# Patient Record
Sex: Male | Born: 1945 | Race: Black or African American | Hispanic: No | Marital: Married | Smoking: Never smoker
Health system: Southern US, Community
[De-identification: ages and names within clinical notes are randomized; demographics above are authoritative.]

---

## 2016-11-15 ENCOUNTER — Other Ambulatory Visit: Payer: Self-pay | Admitting: Gastroenterology

## 2016-11-15 DIAGNOSIS — B181 Chronic viral hepatitis B without delta-agent: Secondary | ICD-10-CM

## 2016-11-16 ENCOUNTER — Ambulatory Visit
Admission: RE | Admit: 2016-11-16 | Discharge: 2016-11-16 | Disposition: A | Discharge status: Home / Self Care | Payer: BC Managed Care – PPO | Source: Ambulatory Visit | Attending: Gastroenterology | Admitting: Gastroenterology

## 2016-11-16 DIAGNOSIS — B181 Chronic viral hepatitis B without delta-agent: Secondary | ICD-10-CM

## 2017-12-19 ENCOUNTER — Other Ambulatory Visit: Payer: Self-pay | Admitting: Internal Medicine

## 2017-12-19 ENCOUNTER — Ambulatory Visit: Payer: BC Managed Care – PPO

## 2017-12-19 DIAGNOSIS — R001 Bradycardia, unspecified: Secondary | ICD-10-CM

## 2018-01-02 ENCOUNTER — Ambulatory Visit (INDEPENDENT_AMBULATORY_CARE_PROVIDER_SITE_OTHER): Payer: BC Managed Care – PPO

## 2018-01-02 DIAGNOSIS — R001 Bradycardia, unspecified: Secondary | ICD-10-CM

## 2018-01-08 ENCOUNTER — Encounter (HOSPITAL_COMMUNITY)
Admission: EM | Disposition: A | Discharge status: Home / Self Care | Payer: Self-pay | Source: Home / Self Care | Attending: Neurological Surgery

## 2018-01-08 ENCOUNTER — Emergency Department (HOSPITAL_COMMUNITY): Payer: BC Managed Care – PPO | Admitting: Certified Registered Nurse Anesthetist

## 2018-01-08 ENCOUNTER — Inpatient Hospital Stay (HOSPITAL_COMMUNITY)
Admission: EM | Admit: 2018-01-08 | Discharge: 2018-01-12 | DRG: 026 | Disposition: A | Discharge status: Home / Self Care | Payer: BC Managed Care – PPO | Attending: Neurological Surgery | Admitting: Neurological Surgery

## 2018-01-08 ENCOUNTER — Emergency Department (HOSPITAL_COMMUNITY): Payer: BC Managed Care – PPO

## 2018-01-08 DIAGNOSIS — I69251 Hemiplegia and hemiparesis following other nontraumatic intracranial hemorrhage affecting right dominant side: Secondary | ICD-10-CM | POA: Diagnosis not present

## 2018-01-08 DIAGNOSIS — I6201 Nontraumatic acute subdural hemorrhage: Secondary | ICD-10-CM | POA: Diagnosis present

## 2018-01-08 DIAGNOSIS — S065X9A Traumatic subdural hemorrhage with loss of consciousness of unspecified duration, initial encounter: Secondary | ICD-10-CM

## 2018-01-08 DIAGNOSIS — Z79899 Other long term (current) drug therapy: Secondary | ICD-10-CM

## 2018-01-08 DIAGNOSIS — I10 Essential (primary) hypertension: Secondary | ICD-10-CM | POA: Diagnosis present

## 2018-01-08 DIAGNOSIS — S065XAA Traumatic subdural hemorrhage with loss of consciousness status unknown, initial encounter: Secondary | ICD-10-CM | POA: Diagnosis present

## 2018-01-08 HISTORY — PX: BURR HOLE: SHX908

## 2018-01-08 LAB — I-STAT TROPONIN, ED: Troponin i, poc: 0 ng/mL (ref 0.00–0.08)

## 2018-01-08 LAB — COMPREHENSIVE METABOLIC PANEL
ALBUMIN: 3.7 g/dL (ref 3.5–5.0)
ALT: 23 U/L (ref 0–44)
AST: 25 U/L (ref 15–41)
Alkaline Phosphatase: 45 U/L (ref 38–126)
Anion gap: 7 (ref 5–15)
BUN: 12 mg/dL (ref 8–23)
CO2: 24 mmol/L (ref 22–32)
Calcium: 8.8 mg/dL — ABNORMAL LOW (ref 8.9–10.3)
Chloride: 100 mmol/L (ref 98–111)
Creatinine, Ser: 1.11 mg/dL (ref 0.61–1.24)
GFR calc Af Amer: >60 mL/min (ref >60)
GFR calc non Af Amer: >60 mL/min (ref >60)
GLUCOSE: 197 mg/dL — AB (ref 70–99)
Potassium: 3.3 mmol/L — ABNORMAL LOW (ref 3.5–5.1)
Sodium: 131 mmol/L — ABNORMAL LOW (ref 135–145)
Total Bilirubin: 1.2 mg/dL (ref 0.3–1.2)
Total Protein: 7.3 g/dL (ref 6.5–8.1)

## 2018-01-08 LAB — CBC
HCT: 38.3 % — ABNORMAL LOW (ref 39.0–52.0)
HEMOGLOBIN: 11.4 g/dL — AB (ref 13.0–17.0)
MCH: 29 pg (ref 26.0–34.0)
MCHC: 29.8 g/dL — ABNORMAL LOW (ref 30.0–36.0)
MCV: 97.5 fL (ref 80.0–100.0)
Platelets: 200 10*3/uL (ref 150–400)
RBC: 3.93 MIL/uL — AB (ref 4.22–5.81)
RDW: 11.7 % (ref 11.5–15.5)
WBC: 6.2 10*3/uL (ref 4.0–10.5)
nRBC: 0 % (ref 0.0–0.2)

## 2018-01-08 LAB — DIFFERENTIAL
ABS IMMATURE GRANULOCYTES: 0.02 10*3/uL (ref 0.00–0.07)
Basophils Absolute: 0 10*3/uL (ref 0.0–0.1)
Basophils Relative: 0 %
Eosinophils Absolute: 0 10*3/uL (ref 0.0–0.5)
Eosinophils Relative: 1 %
IMMATURE GRANULOCYTES: 0 %
Lymphocytes Relative: 11 %
Lymphs Abs: 0.7 10*3/uL (ref 0.7–4.0)
Monocytes Absolute: 0.9 10*3/uL (ref 0.1–1.0)
Monocytes Relative: 15 %
NEUTROS ABS: 4.5 10*3/uL (ref 1.7–7.7)
Neutrophils Relative %: 73 %

## 2018-01-08 LAB — PROTIME-INR
INR: 1.09
Prothrombin Time: 14 seconds (ref 11.4–15.2)

## 2018-01-08 LAB — APTT: APTT: 31 s (ref 24–36)

## 2018-01-08 LAB — MRSA PCR SCREENING: MRSA by PCR: NEGATIVE

## 2018-01-08 SURGERY — CREATION, CRANIAL BURR HOLE
Anesthesia: General | Site: Head | Laterality: Right

## 2018-01-08 MED ORDER — SODIUM CHLORIDE 0.9 % IV SOLN
INTRAVENOUS | Status: DC | PRN
  Administered 2018-01-08: 25 ug/min via INTRAVENOUS

## 2018-01-08 MED ORDER — ONDANSETRON HCL 4 MG PO TABS: 4.0000 mg | ORAL_TABLET | ORAL | Status: DC | PRN

## 2018-01-08 MED ORDER — POLYETHYLENE GLYCOL 3350 17 G PO PACK: 17.0000 g | PACK | Freq: Every day | ORAL | Status: DC | PRN

## 2018-01-08 MED ORDER — SODIUM CHLORIDE 0.9 % IV BOLUS
500.0000 mL | Freq: Once | INTRAVENOUS | Status: AC
  Administered 2018-01-08: 500 mL via INTRAVENOUS

## 2018-01-08 MED ORDER — ACETAMINOPHEN 650 MG RE SUPP: 650.0000 mg | RECTAL | Status: DC | PRN

## 2018-01-08 MED ORDER — LABETALOL HCL 5 MG/ML IV SOLN: 10.0000 mg | INTRAVENOUS | Status: DC | PRN

## 2018-01-08 MED ORDER — LIDOCAINE-EPINEPHRINE 1 %-1:100000 IJ SOLN
INTRAMUSCULAR | Status: DC | PRN
  Administered 2018-01-08: 3 mL

## 2018-01-08 MED ORDER — BRIMONIDINE TARTRATE 0.2 % OP SOLN
1.0000 [drp] | Freq: Two times a day (BID) | OPHTHALMIC | Status: DC
  Administered 2018-01-08 – 2018-01-12 (×8): 1 [drp] via OPHTHALMIC
  Filled 2018-01-08: qty 5

## 2018-01-08 MED ORDER — BACITRACIN ZINC 500 UNIT/GM EX OINT
TOPICAL_OINTMENT | CUTANEOUS | Status: AC
  Filled 2018-01-08: qty 28.35

## 2018-01-08 MED ORDER — MEPERIDINE HCL 50 MG/ML IJ SOLN: 6.2500 mg | INTRAMUSCULAR | Status: DC | PRN

## 2018-01-08 MED ORDER — SODIUM CHLORIDE 0.9 % IV SOLN
INTRAVENOUS | Status: DC | PRN
  Administered 2018-01-08: 15:00:00 via INTRAVENOUS

## 2018-01-08 MED ORDER — LIDOCAINE 2% (20 MG/ML) 5 ML SYRINGE
INTRAMUSCULAR | Status: DC | PRN
  Administered 2018-01-08: 100 mg via INTRAVENOUS

## 2018-01-08 MED ORDER — DEXAMETHASONE SODIUM PHOSPHATE 10 MG/ML IJ SOLN
INTRAMUSCULAR | Status: AC
  Filled 2018-01-08: qty 1

## 2018-01-08 MED ORDER — ACETAMINOPHEN 325 MG PO TABS
650.0000 mg | ORAL_TABLET | ORAL | Status: DC | PRN
  Administered 2018-01-11: 650 mg via ORAL
  Filled 2018-01-08: qty 2

## 2018-01-08 MED ORDER — LOSARTAN POTASSIUM-HCTZ 50-12.5 MG PO TABS: 1.0000 | ORAL_TABLET | Freq: Every day | ORAL | Status: DC

## 2018-01-08 MED ORDER — MIDAZOLAM HCL 2 MG/2ML IJ SOLN
INTRAMUSCULAR | Status: AC
  Filled 2018-01-08: qty 2

## 2018-01-08 MED ORDER — HYDROMORPHONE HCL 1 MG/ML IJ SOLN
0.5000 mg | INTRAMUSCULAR | Status: DC | PRN
  Administered 2018-01-08: 0.5 mg via INTRAVENOUS
  Filled 2018-01-08: qty 1

## 2018-01-08 MED ORDER — THROMBIN 20000 UNITS EX SOLR
CUTANEOUS | Status: AC
  Filled 2018-01-08: qty 20000

## 2018-01-08 MED ORDER — TIMOLOL MALEATE 0.5 % OP SOLN
1.0000 [drp] | Freq: Two times a day (BID) | OPHTHALMIC | Status: DC
  Administered 2018-01-08 – 2018-01-12 (×8): 1 [drp] via OPHTHALMIC
  Filled 2018-01-08: qty 5

## 2018-01-08 MED ORDER — EPHEDRINE SULFATE 50 MG/ML IJ SOLN
INTRAMUSCULAR | Status: DC | PRN
  Administered 2018-01-08: 10 mg via INTRAVENOUS

## 2018-01-08 MED ORDER — ROCURONIUM BROMIDE 10 MG/ML (PF) SYRINGE
PREFILLED_SYRINGE | INTRAVENOUS | Status: DC | PRN
  Administered 2018-01-08: 50 mg via INTRAVENOUS

## 2018-01-08 MED ORDER — MIDAZOLAM HCL 2 MG/2ML IJ SOLN
INTRAMUSCULAR | Status: DC | PRN
  Administered 2018-01-08: 2 mg via INTRAVENOUS

## 2018-01-08 MED ORDER — SODIUM CHLORIDE 0.9 % IV SOLN
100.0000 mL/h | INTRAVENOUS | Status: DC
  Administered 2018-01-08: 100 mL/h via INTRAVENOUS

## 2018-01-08 MED ORDER — PHENYLEPHRINE 40 MCG/ML (10ML) SYRINGE FOR IV PUSH (FOR BLOOD PRESSURE SUPPORT)
PREFILLED_SYRINGE | INTRAVENOUS | Status: AC
  Filled 2018-01-08: qty 10

## 2018-01-08 MED ORDER — CEFAZOLIN SODIUM-DEXTROSE 2-4 GM/100ML-% IV SOLN
INTRAVENOUS | Status: AC
  Filled 2018-01-08: qty 100

## 2018-01-08 MED ORDER — DEXAMETHASONE SODIUM PHOSPHATE 10 MG/ML IJ SOLN
INTRAMUSCULAR | Status: DC | PRN
  Administered 2018-01-08: 10 mg via INTRAVENOUS

## 2018-01-08 MED ORDER — LIDOCAINE 2% (20 MG/ML) 5 ML SYRINGE
INTRAMUSCULAR | Status: AC
  Filled 2018-01-08: qty 5

## 2018-01-08 MED ORDER — 0.9 % SODIUM CHLORIDE (POUR BTL) OPTIME
TOPICAL | Status: DC | PRN
  Administered 2018-01-08 (×2): 1000 mL

## 2018-01-08 MED ORDER — ONDANSETRON HCL 4 MG/2ML IJ SOLN
INTRAMUSCULAR | Status: DC | PRN
  Administered 2018-01-08: 4 mg via INTRAVENOUS

## 2018-01-08 MED ORDER — TENOFOVIR ALAFENAMIDE FUMARATE 25 MG PO TABS
25.0000 mg | ORAL_TABLET | Freq: Every day | ORAL | Status: DC
  Administered 2018-01-08 – 2018-01-11 (×4): 25 mg via ORAL
  Filled 2018-01-08 (×5): qty 1

## 2018-01-08 MED ORDER — ONDANSETRON HCL 4 MG/2ML IJ SOLN: 4.0000 mg | INTRAMUSCULAR | Status: DC | PRN

## 2018-01-08 MED ORDER — LIDOCAINE-EPINEPHRINE 1 %-1:100000 IJ SOLN
INTRAMUSCULAR | Status: AC
  Filled 2018-01-08: qty 1

## 2018-01-08 MED ORDER — FAMOTIDINE IN NACL 20-0.9 MG/50ML-% IV SOLN
20.0000 mg | Freq: Two times a day (BID) | INTRAVENOUS | Status: DC
  Administered 2018-01-08 – 2018-01-09 (×3): 20 mg via INTRAVENOUS
  Filled 2018-01-08 (×3): qty 50

## 2018-01-08 MED ORDER — HYDROMORPHONE HCL 1 MG/ML IJ SOLN: 0.2500 mg | INTRAMUSCULAR | Status: DC | PRN

## 2018-01-08 MED ORDER — SUCCINYLCHOLINE CHLORIDE 200 MG/10ML IV SOSY
PREFILLED_SYRINGE | INTRAVENOUS | Status: AC
  Filled 2018-01-08: qty 10

## 2018-01-08 MED ORDER — SODIUM CHLORIDE 0.9 % IV SOLN
INTRAVENOUS | Status: DC | PRN
  Administered 2018-01-08: 16:00:00

## 2018-01-08 MED ORDER — PHENYLEPHRINE HCL 10 MG/ML IJ SOLN
INTRAMUSCULAR | Status: DC | PRN
  Administered 2018-01-08: 40 ug via INTRAVENOUS
  Administered 2018-01-08: 120 ug via INTRAVENOUS

## 2018-01-08 MED ORDER — LOSARTAN POTASSIUM 50 MG PO TABS
50.0000 mg | ORAL_TABLET | Freq: Every day | ORAL | Status: DC
  Administered 2018-01-09 – 2018-01-12 (×4): 50 mg via ORAL
  Filled 2018-01-08 (×4): qty 1

## 2018-01-08 MED ORDER — FENTANYL CITRATE (PF) 250 MCG/5ML IJ SOLN
INTRAMUSCULAR | Status: AC
  Filled 2018-01-08: qty 5

## 2018-01-08 MED ORDER — HYDROCODONE-ACETAMINOPHEN 5-325 MG PO TABS
1.0000 | ORAL_TABLET | ORAL | Status: DC | PRN
  Administered 2018-01-08 – 2018-01-11 (×8): 1 via ORAL
  Filled 2018-01-08 (×8): qty 1

## 2018-01-08 MED ORDER — CEFAZOLIN SODIUM-DEXTROSE 2-4 GM/100ML-% IV SOLN
2.0000 g | Freq: Three times a day (TID) | INTRAVENOUS | Status: AC
  Administered 2018-01-08 – 2018-01-09 (×2): 2 g via INTRAVENOUS
  Filled 2018-01-08 (×2): qty 100

## 2018-01-08 MED ORDER — HYDROCHLOROTHIAZIDE 12.5 MG PO CAPS
12.5000 mg | ORAL_CAPSULE | Freq: Every day | ORAL | Status: DC
  Administered 2018-01-09 – 2018-01-12 (×4): 12.5 mg via ORAL
  Filled 2018-01-08 (×4): qty 1

## 2018-01-08 MED ORDER — THROMBIN 20000 UNITS EX SOLR
CUTANEOUS | Status: DC | PRN
  Administered 2018-01-08: 16:00:00 via TOPICAL

## 2018-01-08 MED ORDER — DOCUSATE SODIUM 100 MG PO CAPS
100.0000 mg | ORAL_CAPSULE | Freq: Two times a day (BID) | ORAL | Status: DC
  Administered 2018-01-08 – 2018-01-12 (×8): 100 mg via ORAL
  Filled 2018-01-08 (×8): qty 1

## 2018-01-08 MED ORDER — PROMETHAZINE HCL 25 MG PO TABS: 12.5000 mg | ORAL_TABLET | ORAL | Status: DC | PRN

## 2018-01-08 MED ORDER — BRIMONIDINE TARTRATE-TIMOLOL 0.2-0.5 % OP SOLN: 1.0000 [drp] | Freq: Two times a day (BID) | OPHTHALMIC | Status: DC

## 2018-01-08 MED ORDER — ONDANSETRON HCL 4 MG/2ML IJ SOLN: 4.0000 mg | Freq: Once | INTRAMUSCULAR | Status: DC | PRN

## 2018-01-08 MED ORDER — BACITRACIN ZINC 500 UNIT/GM EX OINT
TOPICAL_OINTMENT | CUTANEOUS | Status: DC | PRN
  Administered 2018-01-08: 1 via TOPICAL

## 2018-01-08 MED ORDER — SUGAMMADEX SODIUM 200 MG/2ML IV SOLN
INTRAVENOUS | Status: DC | PRN
  Administered 2018-01-08: 200 mg via INTRAVENOUS

## 2018-01-08 MED ORDER — PROPOFOL 10 MG/ML IV BOLUS
INTRAVENOUS | Status: AC
  Filled 2018-01-08: qty 20

## 2018-01-08 MED ORDER — PROPOFOL 10 MG/ML IV BOLUS
INTRAVENOUS | Status: DC | PRN
  Administered 2018-01-08: 120 mg via INTRAVENOUS

## 2018-01-08 MED ORDER — CEFAZOLIN SODIUM-DEXTROSE 2-3 GM-%(50ML) IV SOLR
INTRAVENOUS | Status: DC | PRN
  Administered 2018-01-08: 2 g via INTRAVENOUS

## 2018-01-08 MED ORDER — ONDANSETRON HCL 4 MG/2ML IJ SOLN
INTRAMUSCULAR | Status: AC
  Filled 2018-01-08: qty 2

## 2018-01-08 MED ORDER — FENTANYL CITRATE (PF) 250 MCG/5ML IJ SOLN
INTRAMUSCULAR | Status: DC | PRN
  Administered 2018-01-08: 50 ug via INTRAVENOUS
  Administered 2018-01-08: 100 ug via INTRAVENOUS

## 2018-01-08 MED ORDER — ROCURONIUM BROMIDE 50 MG/5ML IV SOSY
PREFILLED_SYRINGE | INTRAVENOUS | Status: AC
  Filled 2018-01-08: qty 5

## 2018-01-08 SURGICAL SUPPLY — 48 items
BAG DECANTER FOR FLEXI CONT (MISCELLANEOUS) ×2 IMPLANT
BLADE SURG 11 STRL SS (BLADE) ×2 IMPLANT
BUR ACORN 9.0 PRECISION (BURR) ×2 IMPLANT
CANISTER SUCT 3000ML PPV (MISCELLANEOUS) ×2 IMPLANT
CARTRIDGE OIL MAESTRO DRILL (MISCELLANEOUS) ×1 IMPLANT
CATH VENTRIC 35X38 W/TROCAR LG (CATHETERS) ×2 IMPLANT
COVER WAND RF STERILE (DRAPES) ×2 IMPLANT
DECANTER SPIKE VIAL GLASS SM (MISCELLANEOUS) ×2 IMPLANT
DERMABOND ADVANCED (GAUZE/BANDAGES/DRESSINGS) ×1
DERMABOND ADVANCED .7 DNX12 (GAUZE/BANDAGES/DRESSINGS) ×1 IMPLANT
DIFFUSER DRILL AIR PNEUMATIC (MISCELLANEOUS) ×2 IMPLANT
DRAPE NEUROLOGICAL W/INCISE (DRAPES) ×2 IMPLANT
DRAPE SURG 17X23 STRL (DRAPES) IMPLANT
DRAPE WARM FLUID 44X44 (DRAPE) ×2 IMPLANT
DRSG OPSITE POSTOP 4X6 (GAUZE/BANDAGES/DRESSINGS) ×4 IMPLANT
ELECT REM PT RETURN 9FT ADLT (ELECTROSURGICAL) ×2
ELECTRODE REM PT RTRN 9FT ADLT (ELECTROSURGICAL) ×1 IMPLANT
GAUZE 4X4 16PLY RFD (DISPOSABLE) IMPLANT
GAUZE SPONGE 4X4 12PLY STRL (GAUZE/BANDAGES/DRESSINGS) IMPLANT
GLOVE BIO SURGEON STRL SZ7 (GLOVE) ×2 IMPLANT
GLOVE BIO SURGEON STRL SZ8 (GLOVE) ×2 IMPLANT
GLOVE BIOGEL PI IND STRL 7.0 (GLOVE) ×1 IMPLANT
GLOVE BIOGEL PI INDICATOR 7.0 (GLOVE) ×1
GLOVE INDICATOR 8.5 STRL (GLOVE) ×2 IMPLANT
GOWN STRL REUS W/ TWL LRG LVL3 (GOWN DISPOSABLE) ×2 IMPLANT
GOWN STRL REUS W/ TWL XL LVL3 (GOWN DISPOSABLE) ×2 IMPLANT
GOWN STRL REUS W/TWL 2XL LVL3 (GOWN DISPOSABLE) IMPLANT
GOWN STRL REUS W/TWL LRG LVL3 (GOWN DISPOSABLE) ×2
GOWN STRL REUS W/TWL XL LVL3 (GOWN DISPOSABLE) ×2
HEMOSTAT SURGICEL 2X14 (HEMOSTASIS) IMPLANT
HOOK DURA (MISCELLANEOUS) ×2 IMPLANT
KIT BASIN OR (CUSTOM PROCEDURE TRAY) ×2 IMPLANT
KIT DRAIN CSF ACCUDRAIN (MISCELLANEOUS) ×2 IMPLANT
KIT TURNOVER KIT B (KITS) ×2 IMPLANT
NEEDLE HYPO 25X1 1.5 SAFETY (NEEDLE) ×2 IMPLANT
NS IRRIG 1000ML POUR BTL (IV SOLUTION) ×4 IMPLANT
OIL CARTRIDGE MAESTRO DRILL (MISCELLANEOUS) ×2
PACK CRANIOTOMY CUSTOM (CUSTOM PROCEDURE TRAY) ×2 IMPLANT
PAD ARMBOARD 7.5X6 YLW CONV (MISCELLANEOUS) ×6 IMPLANT
SPONGE NEURO XRAY DETECT 1X3 (DISPOSABLE) IMPLANT
SPONGE SURGIFOAM ABS GEL 100 (HEMOSTASIS) ×2 IMPLANT
STAPLER VISISTAT 35W (STAPLE) ×2 IMPLANT
SUT NURALON 4 0 TR CR/8 (SUTURE) ×4 IMPLANT
SUT VIC AB 2-0 CT1 18 (SUTURE) ×2 IMPLANT
TOWEL GREEN STERILE (TOWEL DISPOSABLE) ×2 IMPLANT
TOWEL GREEN STERILE FF (TOWEL DISPOSABLE) ×2 IMPLANT
TRAY FOLEY MTR SLVR 16FR STAT (SET/KITS/TRAYS/PACK) ×2 IMPLANT
WATER STERILE IRR 1000ML POUR (IV SOLUTION) ×2 IMPLANT

## 2018-01-08 NOTE — ED Notes (Signed)
Pt taken to OR bay 36, handoff to CRNA Alisha.

## 2018-01-08 NOTE — Anesthesia Procedure Notes (Signed)
Procedure Name: Intubation Performed by: Clearnce Sorrel, CRNA Pre-anesthesia Checklist: Patient identified, Emergency Drugs available, Suction available, Patient being monitored and Timeout performed Patient Re-evaluated:Patient Re-evaluated prior to induction Oxygen Delivery Method: Circle system utilized Preoxygenation: Pre-oxygenation with 100% oxygen Induction Type: IV induction Ventilation: Mask ventilation without difficulty Laryngoscope Size: Mac and 4 Grade View: Grade I Tube type: Oral Tube size: 7.5 mm Number of attempts: 1 Airway Equipment and Method: Stylet Placement Confirmation: ETT inserted through vocal cords under direct vision,  positive ETCO2 and breath sounds checked- equal and bilateral Secured at: 22 cm Tube secured with: Tape Dental Injury: Teeth and Oropharynx as per pre-operative assessment

## 2018-01-08 NOTE — Anesthesia Postprocedure Evaluation (Signed)
Anesthesia Post Note  Patient: John Diaz  Procedure(s) Performed: Ines BloomerBURR HOLES (Right Head)     Patient location during evaluation: PACU Anesthesia Type: General Level of consciousness: awake and alert Pain management: pain level controlled Vital Signs Assessment: post-procedure vital signs reviewed and stable Respiratory status: spontaneous breathing, nonlabored ventilation, respiratory function stable and patient connected to nasal cannula oxygen Cardiovascular status: blood pressure returned to baseline and stable Postop Assessment: no apparent nausea or vomiting Anesthetic complications: no    Last Vitals:  Vitals:   01/08/18 1900 01/08/18 1918  BP: (!) 143/67 (!) 143/67  Pulse: (!) 57 (!) 57  Resp: 16 16  Temp:  37.2 C  SpO2: 97%     Last Pain:  Vitals:   01/08/18 1918  TempSrc: Oral  PainSc:                  Cristy Colmenares DAVID

## 2018-01-08 NOTE — ED Provider Notes (Signed)
[facility] EMERGENCY DEPARTMENT Provider Note   CSN: [id number] Arrival date & time: 01/08/18  1220     History   Chief Complaint Chief Complaint  Patient presents with  . Weakness    HPI Jamieon Lannen is a 72 y.o. male.  HPI  Patient presents with concern of weakness and speech difficulty. Patient states that he is generally well, but now, since some point yesterday, about 24 hours ago he has felt weakness in his right side, as well as slowness of speech. No pain, no confusion, no syncope. He denies recent medication change, diet change, activity change. He has no history of CVA. Patient is here, via EMS, who assists with the HPI.   Past medical history Hypertension    Home Medications    Prior to Admission medications   Medication Sig Start Date End Date Taking? Authorizing Provider  COMBIGAN 0.2-0.5 % ophthalmic solution Place 1 drop into both eyes 2 (two) times daily. 11/10/17  Yes [provider]  losartan-hydrochlorothiazide (HYZAAR) 50-12.5 MG tablet Take 1 tablet by mouth daily. 01/04/18  Yes [provider]  VEMLIDY 25 MG TABS Take 25 mg by mouth daily. 01/03/18  Yes [provider]  Vitamin D, Ergocalciferol, (DRISDOL) 1.25 MG (50000 UT) CAPS capsule Take 50,000 Units by mouth every 7 (seven) days.   Yes [provider]     Social History Does not smoke, does not drink  Lives alone  Allergies   Patient has no known allergies.   Review of Systems Review of Systems  Constitutional:       Per HPI, otherwise negative  HENT:       Per HPI, otherwise negative  Respiratory:       Per HPI, otherwise negative  Cardiovascular:       Per HPI, otherwise negative  Gastrointestinal: Negative for vomiting.  Endocrine:       Negative aside from HPI  Genitourinary:       Neg aside from HPI   Musculoskeletal:       Per HPI, otherwise negative  Skin: Negative.   Neurological: Positive for speech  difficulty and weakness. Negative for syncope.     Physical Exam Updated Vital Signs BP 117/61   Pulse 71   Temp 98.7 F (37.1 C) (Oral)   Resp 19   SpO2 96%   Physical Exam  Constitutional: He is oriented to person, place, and time. He appears well-developed. No distress.  HENT:  Head: Normocephalic and atraumatic.  Eyes: Conjunctivae and EOM are normal.  Cardiovascular: Normal rate and regular rhythm.  Pulmonary/Chest: Effort normal. No stridor. No respiratory distress.  Abdominal: He exhibits no distension.  Musculoskeletal: He exhibits no edema.  Neurological: He is alert and oriented to person, place, and time. He displays no atrophy.  Speech slow, significantly deliberate, slight slurring, no facial asymmetry. Both upper extremities have 5/5 strength with equal grip strength bilaterally. Right lower extremities 3/5 strength. Extraocular motion intact, patient tracks appropriately.   Skin: Skin is warm and dry.  Psychiatric: He has a normal mood and affect.  Nursing note and vitals reviewed.    ED Treatments / Results  Labs (all labs ordered are listed, but only abnormal results are displayed) Labs Reviewed  CBC - Abnormal; Notable for the following components:      Result Value   RBC 3.93 (*)    Hemoglobin 11.4 (*)    HCT 38.3 (*)    MCHC 29.8 (*)    All  other components within normal limits  COMPREHENSIVE METABOLIC PANEL - Abnormal; Notable for the following components:   Sodium 131 (*)    Potassium 3.3 (*)    Glucose, Bld 197 (*)    Calcium 8.8 (*)    All other components within normal limits  DIFFERENTIAL  PROTIME-INR  APTT  RAPID URINE DRUG SCREEN, HOSP PERFORMED  URINALYSIS, ROUTINE W REFLEX MICROSCOPIC  I-STAT TROPONIN, ED    EKG EKG Interpretation  Date/Time:  Sunday January 08 2018 12:24:20 EST Ventricular Rate:  83 PR Interval:    QRS Duration: 91 QT Interval:  352 QTC Calculation: 414 R Axis:   82 Text Interpretation:  Sinus rhythm  Prolonged PR interval Borderline right axis deviation Abnormal ekg Confirmed by Gerhard MunchLockwood, Khai Torbert [phone]) on 01/08/2018 1:21:15 PM   Radiology Ct Head Wo Contrast  Result Date: 01/08/2018 CLINICAL DATA:  Right-sided weakness beginning yesterday. EXAM: CT HEAD WITHOUT CONTRAST TECHNIQUE: Contiguous axial images were obtained from the base of the skull through the vertex without intravenous contrast. COMPARISON:  None. FINDINGS: Brain: Left convexity subdural hematoma measuring up to 2.7 cm in thickness, with mass effect and left-to-right shift of 7.5 mm. Underlying brain parenchyma appears normal. No posterior fossa abnormality. No hydrocephalus or ventricular trapping. Vascular: There is atherosclerotic calcification of the major vessels at the base of the brain. Skull: Normal Sinuses/Orbits: Clear/normal Other: None IMPRESSION: Left convexity subdural hematoma measuring up to 2.7 cm in thickness with mass effect and left-to-right shift of 7.5 mm. Critical Value/emergent results were called by telephone at the time of interpretation on 01/08/2018 at 1:50 pm to Dr. Gerhard MunchOBERT Laythan Hayter , who verbally acknowledged these results. Electronically Signed   By: Paulina FusiMark  Shogry M.D.   On: 01/08/2018 13:54    Procedures Procedures (including critical care time)  Medications Ordered in ED Medications  sodium chloride 0.9 % bolus 500 mL (0 mLs Intravenous Stopped 01/08/18 1402)    Followed by  0.9 %  sodium chloride infusion (100 mL/hr Intravenous New Bag/Given 01/08/18 1257)     Initial Impression / Assessment and Plan / ED Course  I have reviewed the triage vital signs and the nursing notes.  Pertinent labs & imaging results that were available during my care of the patient were reviewed by me and considered in my medical decision making (see chart for details).    2:19 PM On repeat exam the patient is in similar condition, awake and alert, though with slow speech. I have discussed the patient's CT scan,  reviewed the findings, with our radiologist. Subsequently discussed the patient's case with our neurosurgical colleagues given finding of subdural hematoma with midline shift. Patient is not on blood thinning medication, remains awake and alert, though with after mentioned slowness of speech, right-sided neurologic deficits. Patient will require admission to our neurosurgical colleagues for further evaluation and management.  Final Clinical Impressions(s) / ED Diagnoses  Subdural hematoma  CRITICAL CARE Performed by: Gerhard Munchobert Nahjae Hoeg Total critical care time: 35 minutes Critical care time was exclusive of separately billable procedures and treating other patients. Critical care was necessary to treat or prevent imminent or life-threatening deterioration. Critical care was time spent personally by me on the following activities: development of treatment plan with patient and/or surrogate as well as nursing, discussions with consultants, evaluation of patient's response to treatment, examination of patient, obtaining history from patient or surrogate, ordering and performing treatments and interventions, ordering and review of laboratory studies, ordering and review of radiographic studies, pulse oximetry and re-evaluation of  patient's condition.    Gerhard Munch, MD 01/08/18 1420

## 2018-01-08 NOTE — Op Note (Signed)
Preoperative diagnosis: Left-sided subacute subdural hematoma  Postoperative diagnosis: Same.   Procedure:left-sided bur hole craniectomy for evacuation of subacute subdural hematoma  Surgeon: Jillyn HiddenGary Tejon Gracie  Asst.: Ervin Knackhomas Ostergaard  Anesthesia: Gen.  EBL: Minimal  History of present illness: 72 year old gentleman who presents with altered mental status speech difficulty workup and difficulty CT scan showed a large left-sided subacute subdural hematoma due the size mass effect and progressive conical syndrome or recommended burr hole craniectomy for evacuation. I extensively went over the risks and benefits of the procedure as well as perioperative course expectations of outcome alternatives of surgery and he understood and agreed to proceed forward.  Operative procedure: Patient brought into the or was induced on general anesthesia positioned supine shoulder bump under his left shoulder head turned the right exposing the superotemporal on the left frontoparietal he. 2 burr hole incisions were drawn out frontally and one parietally 2 bur holes were drilled dura was coagulated and divided in a cruciate fashion a large amount of dark blood came out under pressure consistent with a subacute hematoma. There was a 6 minute cortical membrane that I lysed both in the frontal burr hole and the parietal bur hole to create (medication irrigated copiously from one bur hole and neck utilizing a regular catheter. After all the irrigant coming through was clear I then placed a ventricular catheter as a subdural drain. She was in a debated went to recovery in stable condition. At the end of case on it counts sponge counts were correct.

## 2018-01-08 NOTE — H&P (Signed)
John Diaz is an 72 y.o. male.   Chief Complaint: headache confusion and difficulty with speech and word finding HPI: 72 year old gentleman presented to the ER with confusion and word finding difficulty and speech difficulty. Workup has revealed a 2.7 cm subacute subdural hematoma on the left. Due to the size of the subdural mass effect I recommended burr hole craniectomy possible craniotomy for evacuation of hematoma. I've extensively gone over the risks and benefits of the procedure with the patient and his wife and they understand and agree to proceed forward.  No past medical history on file.    No family history on file. Social History:  has no tobacco, alcohol, and drug history on file.  Allergies: No Known Allergies   (Not in a hospital admission)  Results for orders placed or performed during the hospital encounter of 01/08/18 (from the past 48 hour(s))  CBC     Status: Abnormal   Collection Time: 01/08/18 12:30 PM  Result Value Ref Range   WBC 6.2 4.0 - 10.5 K/uL   RBC 3.93 (L) 4.22 - 5.81 MIL/uL   Hemoglobin 11.4 (L) 13.0 - 17.0 g/dL   HCT 29.538.3 (L) 28.439.0 - 13.252.0 %   MCV 97.5 80.0 - 100.0 fL   MCH 29.0 26.0 - 34.0 pg   MCHC 29.8 (L) 30.0 - 36.0 g/dL   RDW 44.011.7 10.211.5 - 72.515.5 %   Platelets 200 150 - 400 K/uL   nRBC 0.0 0.0 - 0.2 %    Comment: Performed at [facility], [address] [place], [place] [address]  Differential     Status: None   Collection Time: 01/08/18 12:30 PM  Result Value Ref Range   Neutrophils Relative % 73 %   Neutro Abs 4.5 1.7 - 7.7 K/uL   Lymphocytes Relative 11 %   Lymphs Abs 0.7 0.7 - 4.0 K/uL   Monocytes Relative 15 %   Monocytes Absolute 0.9 0.1 - 1.0 K/uL   Eosinophils Relative 1 %   Eosinophils Absolute 0.0 0.0 - 0.5 K/uL   Basophils Relative 0 %   Basophils Absolute 0.0 0.0 - 0.1 K/uL   Immature Granulocytes 0 %   Abs Immature Granulocytes 0.02 0.00 - 0.07 K/uL    Comment: Performed at [facility], [address]  [place], [place] [address]  Comprehensive metabolic panel     Status: Abnormal   Collection Time: 01/08/18 12:30 PM  Result Value Ref Range   Sodium 131 (L) 135 - 145 mmol/L   Potassium 3.3 (L) 3.5 - 5.1 mmol/L   Chloride 100 98 - 111 mmol/L   CO2 24 22 - 32 mmol/L   Glucose, Bld 197 (H) 70 - 99 mg/dL   BUN 12 8 - 23 mg/dL   Creatinine, Ser 2.591.11 0.61 - 1.24 mg/dL   Calcium 8.8 (L) 8.9 - 10.3 mg/dL   Total Protein 7.3 6.5 - 8.1 g/dL   Albumin 3.7 3.5 - 5.0 g/dL   AST 25 15 - 41 U/L   ALT 23 0 - 44 U/L   Alkaline Phosphatase 45 38 - 126 U/L   Total Bilirubin 1.2 0.3 - 1.2 mg/dL   GFR calc non Af Amer >60 >60 mL/min   GFR calc Af Amer >60 >60 mL/min   Anion gap 7 5 - 15    Comment: Performed at [facility], [address] [place], [place] [address]  I-stat troponin, ED     Status: None  Collection Time: 01/08/18  1:20 PM  Result Value Ref Range   Troponin i, poc 0.00 0.00 - 0.08 ng/mL   Comment 3            Comment: Due to the release kinetics of cTnI, a negative result within the first hours of the onset of symptoms does not rule out myocardial infarction with certainty. If myocardial infarction is still suspected, repeat the test at appropriate intervals.   Protime-INR     Status: None   Collection Time: 01/08/18  1:40 PM  Result Value Ref Range   Prothrombin Time 14.0 11.4 - 15.2 seconds   INR 1.09     Comment: Performed at [facility], [address] [place], [place] [address]  APTT     Status: None   Collection Time: 01/08/18  1:40 PM  Result Value Ref Range   aPTT 31 24 - 36 seconds    Comment: Performed at [facility], [address] [place], [place] [address]   Ct Head Wo Contrast  Result Date: 01/08/2018 CLINICAL DATA:  Right-sided weakness beginning yesterday. EXAM: CT HEAD WITHOUT CONTRAST TECHNIQUE: Contiguous axial images were obtained from the base of the skull through the vertex without intravenous contrast. COMPARISON:  None. FINDINGS:  Brain: Left convexity subdural hematoma measuring up to 2.7 cm in thickness, with mass effect and left-to-right shift of 7.5 mm. Underlying brain parenchyma appears normal. No posterior fossa abnormality. No hydrocephalus or ventricular trapping. Vascular: There is atherosclerotic calcification of the major vessels at the base of the brain. Skull: Normal Sinuses/Orbits: Clear/normal Other: None IMPRESSION: Left convexity subdural hematoma measuring up to 2.7 cm in thickness with mass effect and left-to-right shift of 7.5 mm. Critical Value/emergent results were called by telephone at the time of interpretation on 01/08/2018 at 1:50 pm to Dr. Gerhard Munch , who verbally acknowledged these results. Electronically Signed   By: Paulina Fusi M.D.   On: 01/08/2018 13:54    Review of Systems  Neurological: Positive for headaches.    Blood pressure (!) 142/67, pulse 65, temperature 98.7 F (37.1 C), temperature source Oral, resp. rate 18, SpO2 99 %. Physical Exam  Constitutional: He is oriented to person, place, and time.  Neurological: He is alert and oriented to person, place, and time. He has normal strength. GCS eye subscore is 4. GCS verbal subscore is 5. GCS motor subscore is 6.  Patient is awake alert and oriented 4 pupils were equal and cranial nerves are intact and strength is 5 out of 5 upper and lower extremities     Assessment/Plan 72 year old presents for bur holes possible craniotomy for evacuation ofof subdural hematoma  Kanai Berrios P, MD 01/08/2018, 2:52 PM

## 2018-01-08 NOTE — ED Triage Notes (Signed)
[facility] for evaluation of right sided weakness, unknown last known well but was "sometime yesterday" and doesn't remember feeling weak at lunch yesterday. EMS reports right sided weakness and neglect to right visual field. Pt is a/o x4 answering questions appropriately but is slow to respond.

## 2018-01-08 NOTE — Anesthesia Preprocedure Evaluation (Signed)
Anesthesia Evaluation  Patient identified by MRN, date of birth, ID band Patient awake    Reviewed: Allergy & Precautions, NPO status , Patient's Chart, lab work & pertinent test results  Airway Mallampati: I  TM Distance: >3 FB Neck ROM: Full    Dental   Pulmonary    Pulmonary exam normal        Cardiovascular hypertension, Pt. on medications Normal cardiovascular exam     Neuro/Psych    GI/Hepatic   Endo/Other    Renal/GU      Musculoskeletal   Abdominal   Peds  Hematology   Anesthesia Other Findings   Reproductive/Obstetrics                             Anesthesia Physical Anesthesia Plan  ASA: III  Anesthesia Plan: General   Post-op Pain Management:    Induction: Intravenous  PONV Risk Score and Plan: 2 and Ondansetron and Treatment may vary due to age or medical condition  Airway Management Planned: Oral ETT  Additional Equipment:   Intra-op Plan:   Post-operative Plan: Extubation in OR  Informed Consent: I have reviewed the patients History and Physical, chart, labs and discussed the procedure including the risks, benefits and alternatives for the proposed anesthesia with the patient or authorized representative who has indicated his/her understanding and acceptance.     Plan Discussed with: CRNA and Surgeon  Anesthesia Plan Comments:         Anesthesia Quick Evaluation

## 2018-01-08 NOTE — Transfer of Care (Signed)
Immediate Anesthesia Transfer of Care Note  Patient: John Diaz  Procedure(s) Performed: Ines BloomerBURR HOLES (Right Head)  Patient Location: PACU  Anesthesia Type:General  Level of Consciousness: awake, alert  and oriented  Airway & Oxygen Therapy: Patient Spontanous Breathing and Patient connected to nasal cannula oxygen  Post-op Assessment: Report given to RN and Post -op Vital signs reviewed and stable  Post vital signs: Reviewed and stable  Last Vitals:  Vitals Value Taken Time  BP    Temp    Pulse 57 01/08/2018  4:49 PM  Resp 20 01/08/2018  4:49 PM  SpO2 100 % 01/08/2018  4:49 PM  Vitals shown include unvalidated device data.  Last Pain:  Vitals:   01/08/18 1225  TempSrc: Oral         Complications: No apparent anesthesia complications

## 2018-01-08 NOTE — ED Notes (Signed)
Wallet being placed in security. Yellow sheet to be tubed to station 75 for OR, spoke with anesthesia.

## 2018-01-09 ENCOUNTER — Encounter (HOSPITAL_COMMUNITY): Payer: Self-pay | Admitting: Neurosurgery

## 2018-01-09 ENCOUNTER — Inpatient Hospital Stay (HOSPITAL_COMMUNITY): Payer: BC Managed Care – PPO

## 2018-01-09 NOTE — Progress Notes (Signed)
Subjective: Patient reports doing well improved headache improved speech  Objective: Vital signs in last 24 hours: Temp:  [98 F (36.7 C)-98.9 F (37.2 C)] 98.5 F (36.9 C) (12/09 0800) Pulse Rate:  [45-66] 50 (12/09 1000) Resp:  [10-21] 14 (12/09 1000) BP: (106-152)/(49-74) 115/65 (12/09 1000) SpO2:  [93 %-100 %] 98 % (12/09 1000) Weight:  [83.8 kg] 83.8 kg (12/08 1918)  Intake/Output from previous day: 12/08 0701 - 12/09 0700 In: 1374.8 [I.V.:1300; IV Piggyback:74.8] Out: 1627 [Urine:1400; Drains:127; Blood:100] Intake/Output this shift: Total I/O In: 175.3 [IV Piggyback:175.3] Out: 450 [Urine:450]  awake alert oriented strength 5 out of 5  Lab Results: Recent Labs    01/08/18 1230  WBC 6.2  HGB 11.4*  HCT 38.3*  PLT 200   BMET Recent Labs    01/08/18 1230  NA 131*  K 3.3*  CL 100  CO2 24  GLUCOSE 197*  BUN 12  CREATININE 1.11  CALCIUM 8.8*    Studies/Results: Ct Head Wo Contrast  Result Date: 01/09/2018 CLINICAL DATA:  Follow-up examination status post subdural evacuation. EXAM: CT HEAD WITHOUT CONTRAST TECHNIQUE: Contiguous axial images were obtained from the base of the skull through the vertex without intravenous contrast. COMPARISON:  Prior CT from 01/08/2018. FINDINGS: Brain: Postoperative changes from interval left frontal burr hole craniotomy for subdural evacuation. Subdural drain extends via the posterior burr hole with tip overlying the anterior left frontal convexity. Postoperative pneumocephalus present within the left extra-axial space. The left subdural hematoma has been partial evacuation, now measuring up to 17 mm in maximal thickness at the level of the left frontal convexity. Improved mass effect on the subjacent left cerebral hemisphere. Left-to-right midline shift now measures up to 4 mm, improved from previous. No hydrocephalus or ventricular trapping. Basilar cisterns remain patent. Trace subarachnoid hemorrhage present along the  suprasellar cistern extending towards the perimesencephalic cisterns (series 3, image 13). Possible trace hemorrhage surrounding the cerebellum. No other acute intracranial hemorrhage. No acute large vessel territory infarct. No mass lesion. Vascular: No hyperdense vessel. Scattered vascular calcifications noted within the carotid siphons. Skull: Postoperative changes from left frontal burr hole craniotomy at the left scalp. Associated scalp edema and emphysema with overlying skin staples. Sinuses/Orbits: Globes and orbital soft tissues demonstrate no acute finding. Scattered mucosal thickening throughout the paranasal sinuses. Mastoid air cells are clear. Other: None. IMPRESSION: 1. Postoperative changes from interval left frontal burr hole craniotomy for subdural evacuation. Left subdural hematoma decreased in size now measuring up to 17 mm in maximal thickness with improved mass effect on the subjacent left cerebral hemisphere. Left-to-right midline shift now measures 4 mm. 2. Trace subarachnoid hemorrhage along the suprasellar cistern and surrounding the cerebellum. 3. No other new acute intracranial abnormality. Electronically Signed   By: Rise Mu M.D.   On: 01/09/2018 02:38   Ct Head Wo Contrast  Result Date: 01/08/2018 CLINICAL DATA:  Right-sided weakness beginning yesterday. EXAM: CT HEAD WITHOUT CONTRAST TECHNIQUE: Contiguous axial images were obtained from the base of the skull through the vertex without intravenous contrast. COMPARISON:  None. FINDINGS: Brain: Left convexity subdural hematoma measuring up to 2.7 cm in thickness, with mass effect and left-to-right shift of 7.5 mm. Underlying brain parenchyma appears normal. No posterior fossa abnormality. No hydrocephalus or ventricular trapping. Vascular: There is atherosclerotic calcification of the major vessels at the base of the brain. Skull: Normal Sinuses/Orbits: Clear/normal Other: None IMPRESSION: Left convexity subdural hematoma  measuring up to 2.7 cm in thickness with mass effect and left-to-right  shift of 7.5 mm. Critical Value/emergent results were called by telephone at the time of interpretation on 01/08/2018 at 1:50 pm to Dr. Gerhard MunchOBERT LOCKWOOD , who verbally acknowledged these results. Electronically Signed   By: Paulina FusiMark  Shogry M.D.   On: 01/08/2018 13:54    Assessment/Plan: Postoperative day 1 burr hole craniectomy for evacuation of subacute subdural hematoma. Postop the CT scan shows significant improvement mass effect still some chronic CSF mixed with all blood residual remaining drain has not been up very much continuing 1 more day DC in the morning unless he starts putting out what appeared to be spinal fluid and we'll clamp it off and remove it.  LOS: 1 day     Darielys Giglia P 01/09/2018, 1:01 PM

## 2018-01-09 NOTE — Evaluation (Signed)
Physical Therapy Evaluation Patient Details Name: John Diaz MRN: [id number] DOB: 12/08/1945 Today's Date: 01/09/2018   History of Present Illness  72 year-old gentleman presented to the ER with confusion and word finding difficulty and speech difficulty. Workup has revealed a 2.7 cm subacute subdural hematoma on the left. Pt underwent burrhole craniectomy for evacuation of hematoma, now with drain placed.  Clinical Impression  Pt admitted with above. Pt functioning near baseline. Pt with noted mild R shakiness/tremor during FTN testing with coordination. Pt with guarded gait pattern at this time but suspect with progress quickly once drain is removed. Acute PT to cont to follow to progress indep with ambulation, stair negotiation and higher level balance.    Follow Up Recommendations No PT follow up;Supervision/Assistance - 24 hour    Equipment Recommendations  None recommended by PT    Recommendations for Other Services       Precautions / Restrictions Precautions Precautions: Fall Precaution Comments: pt with drain for hematoma, doesn't need clamped Restrictions Weight Bearing Restrictions: No      Mobility  Bed Mobility Overal bed mobility: Modified Independent             General bed mobility comments: HOB elevated, no physical assist needed  Transfers Overall transfer level: Needs assistance Equipment used: None Transfers: Sit to/from Stand Sit to Stand: Min guard         General transfer comment: min guard due to drain/lines and first time up, no physical assist needed  Ambulation/Gait Ambulation/Gait assistance: Min guard Gait Distance (Feet): 200 Feet Assistive device: None Gait Pattern/deviations: Step-through pattern;Decreased stride length Gait velocity: dec Gait velocity interpretation: 1.31 - 2.62 ft/sec, indicative of limited community ambulator General Gait Details: guarded and cautious due to drain on Left side of brain  Stairs             Wheelchair Mobility    Modified Rankin (Stroke Patients Only) Modified Rankin (Stroke Patients Only) Pre-Morbid Rankin Score: No symptoms Modified Rankin: No significant disability     Balance Overall balance assessment: Mild deficits observed, not formally tested                                           Pertinent Vitals/Pain Pain Assessment: No/denies pain    Home Living Family/patient expects to be discharged to:: Private residence Living Arrangements: Alone;Spouse/significant other Available Help at Discharge: Family;Available 24 hours/day Type of Home: House Home Access: Stairs to enter Entrance Stairs-Rails: None Entrance Stairs-Number of Steps: 3 Home Layout: Two level Home Equipment: None Additional Comments: pt has house in [place] where wife stays, 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    Prior Function Level of Independence: Independent         Comments: works fulltime, Theatre stage managerdrives     Hand Dominance   Dominant Hand: Right    Extremity/Trunk Assessment   Upper Extremity Assessment Upper Extremity Assessment: RUE deficits/detail RUE Deficits / Details: grossly 5/5 RUE Coordination: decreased gross motor(tremors with FTN testing)    Lower Extremity Assessment Lower Extremity Assessment: Overall WFL for tasks assessed    Cervical / Trunk Assessment Cervical / Trunk Assessment: Normal  Communication   Communication: No difficulties  Cognition Arousal/Alertness: Awake/alert Behavior During Therapy: WFL for tasks assessed/performed Overall Cognitive Status: Within Functional Limits  for tasks assessed                                        General Comments General comments (skin integrity, edema, etc.): VSS, RR into 30-40 however pt denied difficulty breathing    Exercises     Assessment/Plan    PT Assessment Patient needs  continued PT services  PT Problem List Decreased strength;Decreased activity tolerance;Decreased balance;Decreased mobility;Decreased coordination       PT Treatment Interventions DME instruction;Gait training;Stair training;Functional mobility training;Therapeutic activities;Therapeutic exercise;Balance training;Neuromuscular re-education    PT Goals (Current goals can be found in the Care Plan section)  Acute Rehab PT Goals Patient Stated Goal: go home PT Goal Formulation: With patient Time For Goal Achievement: 01/23/18 Potential to Achieve Goals: Good Additional Goals Additional Goal #1: Pt to score >19 on DGI to indicate minimal falls risk.    Frequency Min 3X/week   Barriers to discharge        Co-evaluation               AM-PAC PT "6 Clicks" Mobility  Outcome Measure Help needed turning from your back to your side while in a flat bed without using bedrails?: None Help needed moving from lying on your back to sitting on the side of a flat bed without using bedrails?: None Help needed moving to and from a bed to a chair (including a wheelchair)?: None Help needed standing up from a chair using your arms (e.g., wheelchair or bedside chair)?: None Help needed to walk in hospital room?: A Little Help needed climbing 3-5 steps with a railing? : A Little 6 Click Score: 22    End of Session Equipment Utilized During Treatment: Gait belt Activity Tolerance: Patient tolerated treatment well Patient left: (standing at sink with OT) Nurse Communication: Mobility status PT Visit Diagnosis: Unsteadiness on feet (R26.81);Difficulty in walking, not elsewhere classified (R26.2)    Time: 6962-9528 PT Time Calculation (min) (ACUTE ONLY): 27 min   Charges:   PT Evaluation $PT Eval Low Complexity: 1 Low PT Treatments $Gait Training: 8-22 mins        Lewis Shock, PT, DPT Acute Rehabilitation Services Pager #: [phone] Office #: [phone]   John Diaz 01/09/2018, 8:59 AM

## 2018-01-09 NOTE — Evaluation (Signed)
Occupational Therapy Evaluation Patient Details Name: John Diaz MRN: [id number] DOB: 03/07/1945 Today's Date: 01/09/2018    History of Present Illness 72 year-old gentleman presented to the ER with confusion and word finding difficulty and speech difficulty. Workup has revealed a 2.7 cm subacute subdural hematoma on the left. Pt underwent burrhole craniectomy for evacuation of hematoma, now with drain placed.   Clinical Impression   PTA patient independent and working.  Currently admitted for above and limited by impaired balance, B UE tremors (R >L) impairing coordination, and decreased activity tolerance. Demonstrates ability to complete grooming standing with min guard, UB ADLs with setup assist and LB ADLs with min guard assist, toilet transfer with min guard. Patient has 24/7 support at home, and based on performance today he will benefit from continued OT services while admitted but anticipate no further needs after dc.  Will continue to follow.     Follow Up Recommendations  No OT follow up;Supervision/Assistance - 24 hour    Equipment Recommendations  3 in 1 bedside commode    Recommendations for Other Services       Precautions / Restrictions Precautions Precautions: Fall Precaution Comments: pt with drain for hematoma, doesn't need clamped Restrictions Weight Bearing Restrictions: No      Mobility Bed Mobility Overal bed mobility: Modified Independent             General bed mobility comments: OOB with PT   Transfers Overall transfer level: Needs assistance Equipment used: None Transfers: Sit to/from Stand Sit to Stand: Min guard         General transfer comment: min guard for safety     Balance Overall balance assessment: Mild deficits observed, not formally tested                                         ADL either performed or assessed with clinical judgement   ADL Overall ADL's : Needs assistance/impaired     Grooming: Min  guard;Standing   Upper Body Bathing: Min guard;Standing   Lower Body Bathing: Min guard;Sit to/from stand   Upper Body Dressing : Set up;Supervision/safety;Sitting   Lower Body Dressing: Min guard;Sit to/from stand   Toilet Transfer: Ambulation;Min Pension scheme managerguard Toilet Transfer Details (indicate cue type and reason): simulated to recliner          Functional mobility during ADLs: Min guard       Vision Baseline Vision/History: Wears glasses Wears Glasses: Reading only Patient Visual Report: No change from baseline Vision Assessment?: Yes Eye Alignment: Within Functional Limits Ocular Range of Motion: Within Functional Limits Alignment/Gaze Preference: Within Defined Limits Tracking/Visual Pursuits: Unable to hold eye position out of midline(to R ) Visual Fields: No apparent deficits Additional Comments: mild nystagmus when tracking towards L      Perception     Praxis      Pertinent Vitals/Pain Pain Assessment: No/denies pain     Hand Dominance Right   Extremity/Trunk Assessment Upper Extremity Assessment Upper Extremity Assessment: RUE deficits/detail;LUE deficits/detail RUE Deficits / Details: grossly 5/5, tremors with functional use RUE Coordination: decreased gross motor LUE Deficits / Details: grossly 5/5, tremors with functional use  LUE Coordination: decreased gross motor   Lower Extremity Assessment Lower Extremity Assessment: Defer to PT evaluation   Cervical / Trunk Assessment Cervical / Trunk Assessment: Normal   Communication Communication Communication: No difficulties   Cognition Arousal/Alertness: Awake/alert Behavior During Therapy:  WFL for tasks assessed/performed Overall Cognitive Status: Within Functional Limits for tasks assessed                                     General Comments  VSS    Exercises     Shoulder Instructions      Home Living Family/patient expects to be discharged to:: Private residence Living  Arrangements: Alone;Spouse/significant other Available Help at Discharge: Family;Available 24 hours/day Type of Home: House Home Access: Stairs to enter [employer] of Steps: 3 Entrance Stairs-Rails: None Home Layout: Two level Alternate Level Stairs-Number of Steps: flight Alternate Level Stairs-Rails: Right Bathroom Shower/Tub: Chief Strategy Officer: Standard     Home Equipment: None   Additional Comments: pt has house in [place] where wife stays, 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      Prior Functioning/Environment Level of Independence: Independent        Comments: works fulltime, Nurse, adult Problem List: Decreased activity tolerance;Impaired balance (sitting and/or standing);Decreased coordination;Decreased knowledge of precautions;Decreased knowledge of use of DME or AE      OT Treatment/Interventions: Self-care/ADL training;Therapeutic exercise;Neuromuscular education;Energy conservation;DME and/or AE instruction;Therapeutic activities;Patient/family education;Balance training    OT Goals(Current goals can be found in the care plan section) Acute Rehab OT Goals Patient Stated Goal: go home OT Goal Formulation: With patient Time For Goal Achievement: 01/23/18 Potential to Achieve Goals: Good  OT Frequency: Min 2X/week   Barriers to D/C:            Co-evaluation              AM-PAC OT "6 Clicks" Daily Activity     Outcome Measure Help from another person eating meals?: None Help from another person taking care of personal grooming?: A Little Help from another person toileting, which includes using toliet, bedpan, or urinal?: A Little Help from another person bathing (including washing, rinsing, drying)?: A Little Help from another person to put on and taking off regular upper body clothing?: A Little Help from another person to put on and  taking off regular lower body clothing?: A Little 6 Click Score: 19   End of Session Equipment Utilized During Treatment: Gait belt Nurse Communication: Mobility status  Activity Tolerance: Patient tolerated treatment well Patient left: in chair;with call bell/phone within reach;with family/visitor present  OT Visit Diagnosis: Other abnormalities of gait and mobility (R26.89);Other symptoms and signs involving the nervous system (R29.898)                Time: 4098-1191 OT Time Calculation (min): 24 min Charges:  OT General Charges $OT Visit: 1 Visit OT Evaluation $OT Eval Moderate Complexity: 1 Mod OT Treatments $Self Care/Home Management : 8-22 mins  Chancy Milroy, OT Acute Rehabilitation Services Pager [phone] Office [phone]   Chancy Milroy 01/09/2018, 9:17 AM

## 2018-01-10 ENCOUNTER — Encounter (HOSPITAL_COMMUNITY): Payer: Self-pay

## 2018-01-10 ENCOUNTER — Other Ambulatory Visit: Payer: Self-pay

## 2018-01-10 MED ORDER — FAMOTIDINE 20 MG PO TABS
20.0000 mg | ORAL_TABLET | Freq: Two times a day (BID) | ORAL | Status: DC
  Administered 2018-01-10 – 2018-01-12 (×5): 20 mg via ORAL
  Filled 2018-01-10 (×5): qty 1

## 2018-01-10 NOTE — Plan of Care (Signed)
  Problem: Skin Integrity: Goal: Demonstration of wound healing without infection will improve Outcome: Progressing  Pt's surgical site looks clean and free from infection  Problem: Clinical Measurements: Goal: Neurologic status will improve Outcome: Progressing Pt has had no neuro changes, maintains stable.   Problem: Pain Managment: Goal: General experience of comfort will improve Outcome: Progressing  Able to have pain managed through out the day.

## 2018-01-10 NOTE — Progress Notes (Signed)
PHARMACIST - PHYSICIAN COMMUNICATION  DR:   Wynetta Emeryram  CONCERNING: IV to Oral Route Change Policy  RECOMMENDATION: This patient is receiving Famotidine by the intravenous route.  Based on criteria approved by the Pharmacy and Therapeutics Committee, the intravenous medication(s) is/are being converted to the equivalent oral dose form(s).   DESCRIPTION: These criteria include:  The patient is eating (either orally or via tube) and/or has been taking other orally administered medications for a least 24 hours  The patient has no evidence of active gastrointestinal bleeding or impaired GI absorption (gastrectomy, short bowel, patient on TNA or NPO).  If you have questions about this conversion, please contact the Pharmacy Department  []   [phone]( [phone] )  [facility] []   [phone]( [phone] )  [facility] [x]   [phone]( [phone] )  [facility] []   [phone]( [phone] )  [facility] []   [phone]( [phone] )  [facility]   Tera MaterCatherine A Kiala Faraj, [facility] 01/10/2018 8:21 AM

## 2018-01-10 NOTE — Progress Notes (Signed)
Physical Therapy Treatment Patient Details Name: John Diaz MRN: [id number] DOB: 04/06/1945 Today's Date: 01/10/2018    History of Present Illness 72 year-old gentleman presented to the ER with confusion and word finding difficulty and speech difficulty. Workup has revealed a 2.7 cm subacute subdural hematoma on the left. Pt underwent burrhole craniectomy for evacuation of hematoma, now with drain placed.    PT Comments    Pt no longer with drain. Pt functioning near baseline but remains slower compared to baseline with mild instability. Pt at higher falls risk as indicated by score of 16 on DGI. Pt reports he feels like hes 60% back to his baseline. Acute PT to cont to follow.    Follow Up Recommendations  No PT follow up;Supervision/Assistance - 24 hour     Equipment Recommendations  None recommended by PT    Recommendations for Other Services       Precautions / Restrictions Precautions Precautions: Fall Restrictions Weight Bearing Restrictions: No    Mobility  Bed Mobility Overal bed mobility: Modified Independent                Transfers Overall transfer level: Needs assistance Equipment used: None Transfers: Sit to/from Stand Sit to Stand: Supervision         General transfer comment: no difficulty, supervision for line management  Ambulation/Gait Ambulation/Gait assistance: Min guard Gait Distance (Feet): 250 Feet Assistive device: None Gait Pattern/deviations: Step-through pattern;Decreased stride length;Wide base of support Gait velocity: dec Gait velocity interpretation: 1.31 - 2.62 ft/sec, indicative of limited community ambulator General Gait Details: pt cont to be slow and guarded, mildly unsteady, pt reports I typically walk fast but doesn't feel safe to do so now   Stairs Stairs: Yes Stairs assistance: Min guard Stair Management: One rail Right;Step to pattern;Forwards Number of Stairs: 12 General stair comments: pt slow and guarded,  chose to complete step to pattern instead of reciprocal as he does typically   Wheelchair Mobility    Modified Rankin (Stroke Patients Only) Modified Rankin (Stroke Patients Only) Pre-Morbid Rankin Score: No symptoms Modified Rankin: No significant disability     Balance                                 Standardized Balance Assessment Standardized Balance Assessment : Dynamic Gait Index   Dynamic Gait Index Level Surface: Mild Impairment Change in Gait Speed: Mild Impairment Gait with Horizontal Head Turns: Mild Impairment Gait with Vertical Head Turns: Mild Impairment Gait and Pivot Turn: Mild Impairment Step Over Obstacle: Mild Impairment Step Around Obstacles: Mild Impairment Steps: Mild Impairment Total Score: 16      Cognition Arousal/Alertness: Awake/alert Behavior During Therapy: WFL for tasks assessed/performed Overall Cognitive Status: Within Functional Limits for tasks assessed                                        Exercises      General Comments General comments (skin integrity, edema, etc.): vss      Pertinent Vitals/Pain Pain Assessment: No/denies pain    Home Living                      Prior Function            PT Goals (current goals can now be found in the care plan section) Acute  Rehab PT Goals Patient Stated Goal: go home Progress towards PT goals: Progressing toward goals    Frequency    Min 3X/week      PT Plan Current plan remains appropriate    Co-evaluation              AM-PAC PT "6 Clicks" Mobility   Outcome Measure  Help needed turning from your back to your side while in a flat bed without using bedrails?: None Help needed moving from lying on your back to sitting on the side of a flat bed without using bedrails?: None Help needed moving to and from a bed to a chair (including a wheelchair)?: None Help needed standing up from a chair using your arms (e.g., wheelchair or  bedside chair)?: A Little Help needed to walk in hospital room?: A Little Help needed climbing 3-5 steps with a railing? : A Little 6 Click Score: 21    End of Session Equipment Utilized During Treatment: Gait belt Activity Tolerance: Patient tolerated treatment well   Nurse Communication: Mobility status PT Visit Diagnosis: Unsteadiness on feet (R26.81);Difficulty in walking, not elsewhere classified (R26.2)     Time: 1610-9604 PT Time Calculation (min) (ACUTE ONLY): 17 min  Charges:  $Gait Training: 8-22 mins                     Lewis Shock, PT, DPT Acute Rehabilitation Services Pager #: [phone] Office #: [phone]    Iona Hansen 01/10/2018, 12:21 PM

## 2018-01-10 NOTE — Progress Notes (Signed)
Subjective: Patient reports overall feeling better less headache  Objective: Vital signs in last 24 hours: Temp:  [98.1 F (36.7 C)-98.2 F (36.8 C)] 98.2 F (36.8 C) (12/10 0400) Pulse Rate:  [47-69] 51 (12/10 0700) Resp:  [8-22] 13 (12/10 0700) BP: (95-165)/(50-76) 116/61 (12/10 0700) SpO2:  [96 %-100 %] 96 % (12/10 0700)  Intake/Output from previous day: 12/09 0701 - 12/10 0700 In: 255.5 [IV Piggyback:255.5] Out: 1875 [Urine:1875] Intake/Output this shift: No intake/output data recorded.  Wound: awake alert oriented strength 5 out of 5 no pronator drift  Lab Results: Recent Labs    01/08/18 1230  WBC 6.2  HGB 11.4*  HCT 38.3*  PLT 200   BMET Recent Labs    01/08/18 1230  NA 131*  K 3.3*  CL 100  CO2 24  GLUCOSE 197*  BUN 12  CREATININE 1.11  CALCIUM 8.8*    Studies/Results: Ct Head Wo Contrast  Result Date: 01/09/2018 CLINICAL DATA:  Follow-up examination status post subdural evacuation. EXAM: CT HEAD WITHOUT CONTRAST TECHNIQUE: Contiguous axial images were obtained from the base of the skull through the vertex without intravenous contrast. COMPARISON:  Prior CT from 01/08/2018. FINDINGS: Brain: Postoperative changes from interval left frontal burr hole craniotomy for subdural evacuation. Subdural drain extends via the posterior burr hole with tip overlying the anterior left frontal convexity. Postoperative pneumocephalus present within the left extra-axial space. The left subdural hematoma has been partial evacuation, now measuring up to 17 mm in maximal thickness at the level of the left frontal convexity. Improved mass effect on the subjacent left cerebral hemisphere. Left-to-right midline shift now measures up to 4 mm, improved from previous. No hydrocephalus or ventricular trapping. Basilar cisterns remain patent. Trace subarachnoid hemorrhage present along the suprasellar cistern extending towards the perimesencephalic cisterns (series 3, image 13). Possible  trace hemorrhage surrounding the cerebellum. No other acute intracranial hemorrhage. No acute large vessel territory infarct. No mass lesion. Vascular: No hyperdense vessel. Scattered vascular calcifications noted within the carotid siphons. Skull: Postoperative changes from left frontal burr hole craniotomy at the left scalp. Associated scalp edema and emphysema with overlying skin staples. Sinuses/Orbits: Globes and orbital soft tissues demonstrate no acute finding. Scattered mucosal thickening throughout the paranasal sinuses. Mastoid air cells are clear. Other: None. IMPRESSION: 1. Postoperative changes from interval left frontal burr hole craniotomy for subdural evacuation. Left subdural hematoma decreased in size now measuring up to 17 mm in maximal thickness with improved mass effect on the subjacent left cerebral hemisphere. Left-to-right midline shift now measures 4 mm. 2. Trace subarachnoid hemorrhage along the suprasellar cistern and surrounding the cerebellum. 3. No other new acute intracranial abnormality. Electronically Signed   By: Rise MuBenjamin  McClintock M.D.   On: 01/09/2018 02:38   Ct Head Wo Contrast  Result Date: 01/08/2018 CLINICAL DATA:  Right-sided weakness beginning yesterday. EXAM: CT HEAD WITHOUT CONTRAST TECHNIQUE: Contiguous axial images were obtained from the base of the skull through the vertex without intravenous contrast. COMPARISON:  None. FINDINGS: Brain: Left convexity subdural hematoma measuring up to 2.7 cm in thickness, with mass effect and left-to-right shift of 7.5 mm. Underlying brain parenchyma appears normal. No posterior fossa abnormality. No hydrocephalus or ventricular trapping. Vascular: There is atherosclerotic calcification of the major vessels at the base of the brain. Skull: Normal Sinuses/Orbits: Clear/normal Other: None IMPRESSION: Left convexity subdural hematoma measuring up to 2.7 cm in thickness with mass effect and left-to-right shift of 7.5 mm. Critical  Value/emergent results were called by telephone at the  time of interpretation on 01/08/2018 at 1:50 pm to Dr. Gerhard Munch , who verbally acknowledged these results. Electronically Signed   By: Paulina Fusi M.D.   On: 01/08/2018 13:54    Assessment/Plan: DC'd subdural drainmobilized with physical occupational therapy transfer to the floor  LOS: 2 days     John Diaz P 01/10/2018, 8:14 AM

## 2018-01-11 ENCOUNTER — Inpatient Hospital Stay (HOSPITAL_COMMUNITY): Payer: BC Managed Care – PPO

## 2018-01-11 MED ORDER — DM-GUAIFENESIN ER 30-600 MG PO TB12
1.0000 | ORAL_TABLET | Freq: Two times a day (BID) | ORAL | Status: DC
  Administered 2018-01-11 – 2018-01-12 (×2): 1 via ORAL
  Filled 2018-01-11 (×2): qty 1

## 2018-01-11 NOTE — Progress Notes (Addendum)
Occupational Therapy Treatment Patient Details Name: John Diaz MRN: [id number] DOB: 1945-10-19 Today's Date: 01/11/2018    History of present illness 72 year-old gentleman presented to the ER with confusion and word finding difficulty and speech difficulty. Workup has revealed a 2.7 cm subacute subdural hematoma on the left. Pt underwent burrhole craniectomy for evacuation of hematoma, now with drain placed.   OT comments  Patient progressing well.  Completes self care, transfers and mobility at supervision level today.  Demonstrating increased functional control and coordination of R UE with no tremors noted during grooming tasks.  Patient educated on safety and fall prevention, agreeable to self care seated and bathing seated on 3:1 commode in shower.  Will continue to follow and progress to modified independence level.    Follow Up Recommendations  No OT follow up;Supervision/Assistance - 24 hour    Equipment Recommendations  3 in 1 bedside commode    Recommendations for Other Services      Precautions / Restrictions Precautions Precautions: Fall Restrictions Weight Bearing Restrictions: No       Mobility Bed Mobility Overal bed mobility: Modified Independent             General bed mobility comments: handoff from PT   Transfers Overall transfer level: Needs assistance Equipment used: None Transfers: Sit to/from Stand Sit to Stand: Supervision         General transfer comment: increased time    Balance Overall balance assessment: Needs assistance Sitting-balance support: No upper extremity supported;Feet supported Sitting balance-Leahy Scale: Good     Standing balance support: No upper extremity supported;During functional activity Standing balance-Leahy Scale: Fair Standing balance comment: close supervision for safety dynamically                High Level Balance Comments: Provided mild perturbations during amb, pt had no LOB. Standardized  Balance Assessment Standardized Balance Assessment : Dynamic Gait Index   Dynamic Gait Index Level Surface: Mild Impairment Change in Gait Speed: Mild Impairment Gait with Horizontal Head Turns: Normal Gait with Vertical Head Turns: Normal Gait and Pivot Turn: Mild Impairment Step Over Obstacle: Normal Step Around Obstacles: Normal Steps: Mild Impairment Total Score: 20     ADL either performed or assessed with clinical judgement   ADL Overall ADL's : Needs assistance/impaired     Grooming: Supervision/safety;Standing;Wash/dry hands;Wash/dry face;Oral care   Upper Body Bathing: Supervision/ safety;Standing Upper Body Bathing Details (indicate cue type and reason): bathing B UEs standing    Lower Body Bathing Details (indicate cue type and reason): reviewed safety seated to bathe LEs, pt agreeable to 3:1 as Independence          Toilet Transfer: Supervision/safety;Ambulation Toilet Transfer Details (indicate cue type and reason): simulated in room     Tub/ Shower Transfer: Tub transfer;Supervision/safety;Ambulation;3 in 1 Tub/Shower Transfer Details (indicate cue type and reason): simulated in room, educated on safety with hand support on wall transferring over threshold  Functional mobility during ADLs: Supervision/safety General ADL Comments: patient progressing well. close to baseline with ADLs, supervision for safety      Vision       Perception     Praxis      Cognition Arousal/Alertness: Awake/alert Behavior During Therapy: WFL for tasks assessed/performed Overall Cognitive Status: Within Functional Limits for tasks assessed  Exercises     Shoulder Instructions       General Comments VSS    Pertinent Vitals/ Pain       Pain Assessment: 0-10 Pain Score: 2  Pain Location: head Pain Intervention(s): Monitored during session  Home Living                                           Prior Functioning/Environment              Frequency  Min 2X/week        Progress Toward Goals  OT Goals(current goals can now be found in the care plan section)  Progress towards OT goals: Progressing toward goals  Acute Rehab OT Goals Patient Stated Goal: go home OT Goal Formulation: With patient Time For Goal Achievement: 01/23/18 Potential to Achieve Goals: Good  Plan Discharge plan remains appropriate;Frequency remains appropriate    Co-evaluation                 AM-PAC OT "6 Clicks" Daily Activity     Outcome Measure   Help from another person eating meals?: None Help from another person taking care of personal grooming?: None Help from another person toileting, which includes using toliet, bedpan, or urinal?: None Help from another person bathing (including washing, rinsing, drying)?: None Help from another person to put on and taking off regular upper body clothing?: None Help from another person to put on and taking off regular lower body clothing?: None 6 Click Score: 24    End of Session    OT Visit Diagnosis: Other abnormalities of gait and mobility (R26.89);Other symptoms and signs involving the nervous system (R29.898)   Activity Tolerance Patient tolerated treatment well   Patient Left with call bell/phone within reach;with family/visitor present;Other (comment)(seated EOB)   Nurse Communication Mobility status        Time: [id number] OT Time Calculation (min): 21 min  Charges: OT General Charges $OT Visit: 1 Visit OT Treatments $Self Care/Home Management : 8-22 mins  Chancy Milroyhristie S Anayeli Arel, OT Acute Rehabilitation Services Pager [phone] Office [phone]     Chancy MilroyChristie S Earmon Sherrow 01/11/2018, 11:21 AM

## 2018-01-11 NOTE — Progress Notes (Signed)
Subjective: Patient reports some mild headache at times. Doing well with PT   Objective: Vital signs in last 24 hours: Temp:  [97.5 F (36.4 C)-99.4 F (37.4 C)] 98 F (36.7 C) (12/11 1227) Pulse Rate:  [56-71] 57 (12/11 1227) Resp:  [20] 20 (12/10 1616) BP: (100-166)/(59-71) 100/61 (12/11 1227) SpO2:  [98 %-100 %] 98 % (12/11 1227)  Intake/Output from previous day: 12/10 0701 - 12/11 0700 In: 219.8 [P.O.:200; IV Piggyback:19.8] Out: 625 [Urine:625] Intake/Output this shift: Total I/O In: 240 [P.O.:240] Out: -   Neurologic: Grossly normal  Lab Results: Lab Results  Component Value Date   WBC 6.2 01/08/2018   HGB 11.4 (L) 01/08/2018   HCT 38.3 (L) 01/08/2018   MCV 97.5 01/08/2018   PLT 200 01/08/2018   Lab Results  Component Value Date   INR 1.09 01/08/2018   BMET Lab Results  Component Value Date   NA 131 (L) 01/08/2018   K 3.3 (L) 01/08/2018   CL 100 01/08/2018   CO2 24 01/08/2018   GLUCOSE 197 (H) 01/08/2018   BUN 12 01/08/2018   CREATININE 1.11 01/08/2018   CALCIUM 8.8 (L) 01/08/2018    Studies/Results: Ct Head Wo Contrast  Result Date: 01/11/2018 CLINICAL DATA:  72 y/o M; subdural hemorrhage, burr hole, for follow-up. EXAM: CT HEAD WITHOUT CONTRAST TECHNIQUE: Contiguous axial images were obtained from the base of the skull through the vertex without intravenous contrast. COMPARISON:  01/09/2018 CT head. FINDINGS: Brain: Left-sided subdural hematoma, predominantly low in attenuation, with multiple hyperintense retracted clots. No new hyperdense material to suggest interval hemorrhage. Decreased air within the collection. Interval removal of the drain. The hematoma measures up to 17 mm in thickness and is is mass effect on the brain with 4 mm of left-to-right midline shift which is stable. Trace volume of subarachnoid hemorrhage with some redistribution over the convexities. No new stroke, brain parenchymal hemorrhage, extra-axial hemorrhage, or focal mass  effect identified. No downward herniation. Stable chronic microvascular ischemic changes and volume loss of the brain. Vascular: Calcific atherosclerosis of carotid siphons. No hyperdense vessel identified. Skull: Postsurgical changes related to left frontal burr hole with air and edema in the overlying scalp as well as skin staples is stable. Sinuses/Orbits: Mild paranasal sinus mucosal thickening. Normal aeration of mastoid air cells. Bilateral intra-ocular lens replacement. Other: None. IMPRESSION: 1. Stable size of left-sided subdural hematoma post drain removal. Stable mass effect with 4 mm left-to-right midline shift. 2. Stable trace subarachnoid hemorrhage with some redistribution over convexities. 3. No new acute intracranial abnormality identified. Electronically Signed   By: Mitzi HansenLance  Furusawa-Stratton M.D.   On: 01/11/2018 00:45    Assessment/Plan: Doing well, continue therapies today. Possible discharge tomorrow.stable head CT.    LOS: 3 days    Tiana LoftKimberly Hannah [facility] 01/11/2018, 3:51 PM

## 2018-01-11 NOTE — Progress Notes (Signed)
Patient has no pain and slept most of the night. John [name], RN

## 2018-01-11 NOTE — Progress Notes (Signed)
Physical Therapy Treatment Patient Details Name: John Diaz MRN: [id number] DOB: 07/08/1945 Today's Date: 01/11/2018    History of Present Illness 72 year-old gentleman presented to the ER with confusion and word finding difficulty and speech difficulty. Workup has revealed a 2.7 cm subacute subdural hematoma on the left. Pt underwent burrhole craniectomy for evacuation of hematoma, now with drain placed.    PT Comments    Pt admitted with above diagnosis. Pt notably takes increased time to stand from sitting EOB, no assist needed. Pt amb with decreased speed and is guarded throughout. Pt ascend/descend 5 stairs with minG alternating pattern, flight of stairs with supervision and step to pattern. When questioned, pt stated he switched patterns because he was being cautious. Pt improved score on DGI to 20/24, indicating minimal falls risk. Pt takes more time with all mobility, pt states walking fast is what cause him to hit his head. Pt will benefit from skilled PT to increase his independence and safety with mobility to allow discharge to home with outpatient neuro PT.    Follow Up Recommendations  Outpatient PT;Supervision/Assistance - 24 hour(outpatient neuro for balance)     Equipment Recommendations  None recommended by PT    Recommendations for Other Services       Precautions / Restrictions Precautions Precautions: Fall Restrictions Weight Bearing Restrictions: No    Mobility  Bed Mobility Overal bed mobility: Modified Independent             General bed mobility comments: HOB elevated  Transfers Overall transfer level: Needs assistance Equipment used: None Transfers: Sit to/from Stand Sit to Stand: Supervision         General transfer comment: increased time  Ambulation/Gait Ambulation/Gait assistance: Min guard   Assistive device: None Gait Pattern/deviations: Step-through pattern;Decreased stride length Gait velocity: decreased Gait velocity  interpretation: 1.31 - 2.62 ft/sec, indicative of limited community ambulator General Gait Details: Pt continues to be slow and guarded, mildly unsteady first 20 feet, steady for rest of amb. Pt reports he feels more steady than yesterday.   Stairs Stairs: Yes Stairs assistance: Min guard;Supervision Stair Management: One rail Right;Alternating pattern;Step to pattern Number of Stairs: 17 General stair comments: Min guard for 5 steps, ascend descend alternating pattern. Supervision for flight, pt descended step to pattern, when questioned why he changed stated it was because he was being cautious, then demonstrated alternating pattern.   Wheelchair Mobility    Modified Rankin (Stroke Patients Only) Modified Rankin (Stroke Patients Only) Pre-Morbid Rankin Score: No symptoms Modified Rankin: No significant disability     Balance Overall balance assessment: Needs assistance                             High Level Balance Comments: Provided mild perturbations during amb, pt had no LOB. Standardized Balance Assessment Standardized Balance Assessment : Dynamic Gait Index   Dynamic Gait Index Level Surface: Mild Impairment Change in Gait Speed: Mild Impairment Gait with Horizontal Head Turns: Normal Gait with Vertical Head Turns: Normal Gait and Pivot Turn: Mild Impairment Step Over Obstacle: Normal Step Around Obstacles: Normal Steps: Mild Impairment Total Score: 20      Cognition Arousal/Alertness: Awake/alert Behavior During Therapy: WFL for tasks assessed/performed Overall Cognitive Status: Within Functional Limits for tasks assessed  Exercises      General Comments        Pertinent Vitals/Pain Pain Assessment: 0-10 Pain Score: 2  Pain Location: head Pain Intervention(s): Monitored during session    Home Living                      Prior Function            PT Goals (current  goals can now be found in the care plan section) Acute Rehab PT Goals Patient Stated Goal: go home PT Goal Formulation: With patient Time For Goal Achievement: 01/23/18 Potential to Achieve Goals: Good Additional Goals Additional Goal #1: Pt score >16 on DGI, indicating minimal falls risk Progress towards PT goals: Progressing toward goals    Frequency    Min 4X/week      PT Plan Current plan remains appropriate    Co-evaluation              AM-PAC PT "6 Clicks" Mobility   Outcome Measure  Help needed turning from your back to your side while in a flat bed without using bedrails?: None Help needed moving from lying on your back to sitting on the side of a flat bed without using bedrails?: None Help needed moving to and from a bed to a chair (including a wheelchair)?: None Help needed standing up from a chair using your arms (e.g., wheelchair or bedside chair)?: None Help needed to walk in hospital room?: A Little Help needed climbing 3-5 steps with a railing? : A Little 6 Click Score: 22    End of Session Equipment Utilized During Treatment: Gait belt Activity Tolerance: Patient tolerated treatment well Patient left: Other (comment)(standing in bathroom with OT) Nurse Communication: Mobility status       Time: 1009-1030 PT Time Calculation (min) (ACUTE ONLY): 21 min  Charges:  $Gait Training: 8-22 mins                     Pattricia Boss, SPT Acute Rehab Services [phone]   Pattricia Boss 01/11/2018, 11:06 AM

## 2018-01-12 MED ORDER — HYDROCODONE-ACETAMINOPHEN 5-325 MG PO TABS: 1.0000 | ORAL_TABLET | ORAL | 0 refills | Status: AC | PRN

## 2018-01-12 NOTE — Care Management Note (Signed)
Case Management Note  Patient Details  Name: John Diaz MRN: [id number] Date of Birth: 12/06/1945  Subjective/Objective:  72 year-old gentleman presented to the ER with confusion and word finding difficulty and speech difficulty. Workup has revealed a 2.7 cm subacute subdural hematoma on the left. Pt underwent burrhole craniectomy for evacuation of hematoma on 01/08/18.  PTA, pt independent, lives with spouse.                   Action/Plan: PT recommending OP follow up, [facility]  [facility] to bedside.   Expected Discharge Date:  01/12/18               Expected Discharge Plan:  OP Rehab  In-House Referral:     Discharge planning Services  CM Consult  Post Acute Care Choice:    Choice offered to:     DME Arranged:  3-N-1 DME Agency:  [facility]  [facility]     Status of Service:  Completed, signed off  If discussed at Long Length of Stay Meetings, dates discussed:    Additional Comments:  Quintella BatonJulie W. Martin Belling, RN, BSN  Trauma/Neuro ICU Case Manager [phone]

## 2018-01-12 NOTE — Discharge Summary (Signed)
Physician Discharge Summary  Patient ID: John Diaz MRN: [id number] DOB/AGE: Feb 05, 1945 72 y.o.  Admit date: 01/08/2018 Discharge date: 01/12/2018  Admission Diagnoses: Left sdh     Discharge Diagnoses: same   Discharged Condition: good  Hospital Course: The patient was admitted on 01/08/2018 and taken to the operating room where the patient underwent crani. The patient tolerated the procedure well and was taken to the recovery room and then to the ICU in stable condition. The hospital course was routine. There were no complications. The wound remained clean dry and intact. Pt had appropriate head soreness. The patient remained afebrile with stable vital signs, and tolerated a regular diet. The patient continued to increase activities, and pain was well controlled with oral pain medications.   Consults: None  Significant Diagnostic Studies:  Results for orders placed or performed during the hospital encounter of 01/08/18  MRSA PCR Screening  Result Value Ref Range   MRSA by PCR NEGATIVE NEGATIVE  CBC  Result Value Ref Range   WBC 6.2 4.0 - 10.5 K/uL   RBC 3.93 (L) 4.22 - 5.81 MIL/uL   Hemoglobin 11.4 (L) 13.0 - 17.0 g/dL   HCT 40.9 (L) 81.1 - 91.4 %   MCV 97.5 80.0 - 100.0 fL   MCH 29.0 26.0 - 34.0 pg   MCHC 29.8 (L) 30.0 - 36.0 g/dL   RDW 78.2 95.6 - 21.3 %   Platelets 200 150 - 400 K/uL   nRBC 0.0 0.0 - 0.2 %  Differential  Result Value Ref Range   Neutrophils Relative % 73 %   Neutro Abs 4.5 1.7 - 7.7 K/uL   Lymphocytes Relative 11 %   Lymphs Abs 0.7 0.7 - 4.0 K/uL   Monocytes Relative 15 %   Monocytes Absolute 0.9 0.1 - 1.0 K/uL   Eosinophils Relative 1 %   Eosinophils Absolute 0.0 0.0 - 0.5 K/uL   Basophils Relative 0 %   Basophils Absolute 0.0 0.0 - 0.1 K/uL   Immature Granulocytes 0 %   Abs Immature Granulocytes 0.02 0.00 - 0.07 K/uL  Comprehensive metabolic panel  Result Value Ref Range   Sodium 131 (L) 135 - 145 mmol/L   Potassium 3.3 (L) 3.5 - 5.1 mmol/L    Chloride 100 98 - 111 mmol/L   CO2 24 22 - 32 mmol/L   Glucose, Bld 197 (H) 70 - 99 mg/dL   BUN 12 8 - 23 mg/dL   Creatinine, Ser 0.86 0.61 - 1.24 mg/dL   Calcium 8.8 (L) 8.9 - 10.3 mg/dL   Total Protein 7.3 6.5 - 8.1 g/dL   Albumin 3.7 3.5 - 5.0 g/dL   AST 25 15 - 41 U/L   ALT 23 0 - 44 U/L   Alkaline Phosphatase 45 38 - 126 U/L   Total Bilirubin 1.2 0.3 - 1.2 mg/dL   GFR calc non Af Amer >60 >60 mL/min   GFR calc Af Amer >60 >60 mL/min   Anion gap 7 5 - 15  Protime-INR  Result Value Ref Range   Prothrombin Time 14.0 11.4 - 15.2 seconds   INR 1.09   APTT  Result Value Ref Range   aPTT 31 24 - 36 seconds  I-stat troponin, ED  Result Value Ref Range   Troponin i, poc 0.00 0.00 - 0.08 ng/mL   Comment 3            Ct Head Wo Contrast  Result Date: 01/11/2018 CLINICAL DATA:  72 y/o M; subdural hemorrhage, burr  hole, for follow-up. EXAM: CT HEAD WITHOUT CONTRAST TECHNIQUE: Contiguous axial images were obtained from the base of the skull through the vertex without intravenous contrast. COMPARISON:  01/09/2018 CT head. FINDINGS: Brain: Left-sided subdural hematoma, predominantly low in attenuation, with multiple hyperintense retracted clots. No new hyperdense material to suggest interval hemorrhage. Decreased air within the collection. Interval removal of the drain. The hematoma measures up to 17 mm in thickness and is is mass effect on the brain with 4 mm of left-to-right midline shift which is stable. Trace volume of subarachnoid hemorrhage with some redistribution over the convexities. No new stroke, brain parenchymal hemorrhage, extra-axial hemorrhage, or focal mass effect identified. No downward herniation. Stable chronic microvascular ischemic changes and volume loss of the brain. Vascular: Calcific atherosclerosis of carotid siphons. No hyperdense vessel identified. Skull: Postsurgical changes related to left frontal burr hole with air and edema in the overlying scalp as well as  skin staples is stable. Sinuses/Orbits: Mild paranasal sinus mucosal thickening. Normal aeration of mastoid air cells. Bilateral intra-ocular lens replacement. Other: None. IMPRESSION: 1. Stable size of left-sided subdural hematoma post drain removal. Stable mass effect with 4 mm left-to-right midline shift. 2. Stable trace subarachnoid hemorrhage with some redistribution over convexities. 3. No new acute intracranial abnormality identified. Electronically Signed   By: Mitzi HansenLance  Furusawa-Stratton M.D.   On: 01/11/2018 00:45   Ct Head Wo Contrast  Result Date: 01/09/2018 CLINICAL DATA:  Follow-up examination status post subdural evacuation. EXAM: CT HEAD WITHOUT CONTRAST TECHNIQUE: Contiguous axial images were obtained from the base of the skull through the vertex without intravenous contrast. COMPARISON:  Prior CT from 01/08/2018. FINDINGS: Brain: Postoperative changes from interval left frontal burr hole craniotomy for subdural evacuation. Subdural drain extends via the posterior burr hole with tip overlying the anterior left frontal convexity. Postoperative pneumocephalus present within the left extra-axial space. The left subdural hematoma has been partial evacuation, now measuring up to 17 mm in maximal thickness at the level of the left frontal convexity. Improved mass effect on the subjacent left cerebral hemisphere. Left-to-right midline shift now measures up to 4 mm, improved from previous. No hydrocephalus or ventricular trapping. Basilar cisterns remain patent. Trace subarachnoid hemorrhage present along the suprasellar cistern extending towards the perimesencephalic cisterns (series 3, image 13). Possible trace hemorrhage surrounding the cerebellum. No other acute intracranial hemorrhage. No acute large vessel territory infarct. No mass lesion. Vascular: No hyperdense vessel. Scattered vascular calcifications noted within the carotid siphons. Skull: Postoperative changes from left frontal burr hole  craniotomy at the left scalp. Associated scalp edema and emphysema with overlying skin staples. Sinuses/Orbits: Globes and orbital soft tissues demonstrate no acute finding. Scattered mucosal thickening throughout the paranasal sinuses. Mastoid air cells are clear. Other: None. IMPRESSION: 1. Postoperative changes from interval left frontal burr hole craniotomy for subdural evacuation. Left subdural hematoma decreased in size now measuring up to 17 mm in maximal thickness with improved mass effect on the subjacent left cerebral hemisphere. Left-to-right midline shift now measures 4 mm. 2. Trace subarachnoid hemorrhage along the suprasellar cistern and surrounding the cerebellum. 3. No other new acute intracranial abnormality. Electronically Signed   By: Rise MuBenjamin  McClintock M.D.   On: 01/09/2018 02:38   Ct Head Wo Contrast  Result Date: 01/08/2018 CLINICAL DATA:  Right-sided weakness beginning yesterday. EXAM: CT HEAD WITHOUT CONTRAST TECHNIQUE: Contiguous axial images were obtained from the base of the skull through the vertex without intravenous contrast. COMPARISON:  None. FINDINGS: Brain: Left convexity subdural hematoma measuring up to  2.7 cm in thickness, with mass effect and left-to-right shift of 7.5 mm. Underlying brain parenchyma appears normal. No posterior fossa abnormality. No hydrocephalus or ventricular trapping. Vascular: There is atherosclerotic calcification of the major vessels at the base of the brain. Skull: Normal Sinuses/Orbits: Clear/normal Other: None IMPRESSION: Left convexity subdural hematoma measuring up to 2.7 cm in thickness with mass effect and left-to-right shift of 7.5 mm. Critical Value/emergent results were called by telephone at the time of interpretation on 01/08/2018 at 1:50 pm to Dr. Gerhard Munch , who verbally acknowledged these results. Electronically Signed   By: Paulina Fusi M.D.   On: 01/08/2018 13:54    Antibiotics:  Anti-infectives (From admission, onward)    Start     Dose/Rate Route Frequency Ordered Stop   01/08/18 2330  ceFAZolin (ANCEF) IVPB 2g/100 mL premix     2 g 200 mL/hr over 30 Minutes Intravenous Every 8 hours 01/08/18 1811 01/09/18 0719   01/08/18 2215  Tenofovir Alafenamide Fumarate TABS 25 mg     25 mg Oral Daily 01/08/18 1811     01/08/18 1552  bacitracin 50,000 Units in sodium chloride 0.9 % 500 mL irrigation  Status:  Discontinued       As needed 01/08/18 1552 01/08/18 1644   01/08/18 1510  ceFAZolin (ANCEF) 2-4 GM/100ML-% IVPB    Note to Pharmacy:  Dairl Ponder   : cabinet override      01/08/18 1510 01/09/18 0314      Discharge Exam: Blood pressure 107/60, pulse 69, temperature 98.9 F (37.2 C), temperature source Oral, resp. rate 20, height 5\' 8"  (1.727 m), weight 83.8 kg, SpO2 100 %. Neurologic: Grossly normal Ambulating and voiding well  Discharge Medications:   Allergies as of 01/12/2018   No Known Allergies     Medication List    TAKE these medications   COMBIGAN 0.2-0.5 % ophthalmic solution Generic drug:  brimonidine-timolol Place 1 drop into both eyes 2 (two) times daily.   HYDROcodone-acetaminophen 5-325 MG tablet Commonly known as:  NORCO/VICODIN Take 1 tablet by mouth every 4 (four) hours as needed for moderate pain.   losartan-hydrochlorothiazide 50-12.5 MG tablet Commonly known as:  HYZAAR Take 1 tablet by mouth daily.   VEMLIDY 25 MG Tabs Generic drug:  Tenofovir Alafenamide Fumarate Take 25 mg by mouth daily.   Vitamin D (Ergocalciferol) 1.25 MG (50000 UT) Caps capsule Commonly known as:  DRISDOL Take 50,000 Units by mouth every 7 (seven) days.            Durable Medical Equipment  (From admission, onward)         Start     Ordered   01/12/18 0740  DME 3-in-1  Once     01/12/18 0740          Disposition: home   Final Dx: crani for sdh  Discharge Instructions    Call MD for:  difficulty breathing, headache or visual disturbances   Complete by:  As directed    Call  MD for:  hives   Complete by:  As directed    Call MD for:  persistant dizziness or light-headedness   Complete by:  As directed    Call MD for:  persistant nausea and vomiting   Complete by:  As directed    Call MD for:  redness, tenderness, or signs of infection (pain, swelling, redness, odor or green/yellow discharge around incision site)   Complete by:  As directed    Call MD for:  severe uncontrolled pain   Complete by:  As directed    Call MD for:  temperature >100.4   Complete by:  As directed    Diet - low sodium heart healthy   Complete by:  As directed    Driving Restrictions   Complete by:  As directed    No driving for 2 weeks, no riding in the car for 1 week   Increase activity slowly   Complete by:  As directed       Follow-up Information    Donalee Citrin, MD. Schedule an appointment as soon as possible for a visit in 2 week(s).   Specialty:  Neurosurgery Contact information: [address] [place] [address] [phone]            Signed: Tiana Loft [facility] 01/12/2018, 7:40 AM

## 2018-01-17 ENCOUNTER — Other Ambulatory Visit: Payer: Self-pay | Admitting: Internal Medicine

## 2018-01-17 ENCOUNTER — Ambulatory Visit
Admission: RE | Admit: 2018-01-17 | Discharge: 2018-01-17 | Disposition: A | Discharge status: Home / Self Care | Payer: BC Managed Care – PPO | Source: Ambulatory Visit | Attending: Internal Medicine | Admitting: Internal Medicine

## 2018-01-17 DIAGNOSIS — R05 Cough: Secondary | ICD-10-CM

## 2018-01-17 DIAGNOSIS — R059 Cough, unspecified: Secondary | ICD-10-CM

## 2018-01-24 ENCOUNTER — Other Ambulatory Visit: Payer: Self-pay | Admitting: Neurosurgery

## 2018-01-24 DIAGNOSIS — S065X9A Traumatic subdural hemorrhage with loss of consciousness of unspecified duration, initial encounter: Secondary | ICD-10-CM

## 2018-01-24 DIAGNOSIS — S065XAA Traumatic subdural hemorrhage with loss of consciousness status unknown, initial encounter: Secondary | ICD-10-CM

## 2018-01-30 ENCOUNTER — Ambulatory Visit
Admission: RE | Admit: 2018-01-30 | Discharge: 2018-01-30 | Disposition: A | Discharge status: Home / Self Care | Payer: BC Managed Care – PPO | Source: Ambulatory Visit | Attending: Neurosurgery | Admitting: Neurosurgery

## 2018-01-30 DIAGNOSIS — S065X9A Traumatic subdural hemorrhage with loss of consciousness of unspecified duration, initial encounter: Secondary | ICD-10-CM

## 2018-01-30 DIAGNOSIS — S065XAA Traumatic subdural hemorrhage with loss of consciousness status unknown, initial encounter: Secondary | ICD-10-CM

## 2018-03-03 ENCOUNTER — Other Ambulatory Visit: Payer: Self-pay | Admitting: Neurosurgery

## 2018-03-03 DIAGNOSIS — S065XAA Traumatic subdural hemorrhage with loss of consciousness status unknown, initial encounter: Secondary | ICD-10-CM

## 2018-03-03 DIAGNOSIS — S065X9A Traumatic subdural hemorrhage with loss of consciousness of unspecified duration, initial encounter: Secondary | ICD-10-CM

## 2018-03-13 ENCOUNTER — Ambulatory Visit
Admission: RE | Admit: 2018-03-13 | Discharge: 2018-03-13 | Disposition: A | Discharge status: Home / Self Care | Payer: BC Managed Care – PPO | Source: Ambulatory Visit | Attending: Neurosurgery | Admitting: Neurosurgery

## 2018-03-13 DIAGNOSIS — S065XAA Traumatic subdural hemorrhage with loss of consciousness status unknown, initial encounter: Secondary | ICD-10-CM

## 2018-03-13 DIAGNOSIS — S065X9A Traumatic subdural hemorrhage with loss of consciousness of unspecified duration, initial encounter: Secondary | ICD-10-CM

## 2018-03-20 ENCOUNTER — Other Ambulatory Visit: Payer: Self-pay | Admitting: Neurosurgery

## 2018-03-20 DIAGNOSIS — S065XAA Traumatic subdural hemorrhage with loss of consciousness status unknown, initial encounter: Secondary | ICD-10-CM

## 2018-03-20 DIAGNOSIS — S065X9A Traumatic subdural hemorrhage with loss of consciousness of unspecified duration, initial encounter: Secondary | ICD-10-CM

## 2018-04-06 ENCOUNTER — Ambulatory Visit
Admission: RE | Admit: 2018-04-06 | Discharge: 2018-04-06 | Disposition: A | Discharge status: Home / Self Care | Payer: BC Managed Care – PPO | Source: Ambulatory Visit | Attending: Neurosurgery | Admitting: Neurosurgery

## 2018-04-06 DIAGNOSIS — S065XAA Traumatic subdural hemorrhage with loss of consciousness status unknown, initial encounter: Secondary | ICD-10-CM

## 2018-04-06 DIAGNOSIS — S065X9A Traumatic subdural hemorrhage with loss of consciousness of unspecified duration, initial encounter: Secondary | ICD-10-CM

## 2018-09-12 ENCOUNTER — Other Ambulatory Visit: Payer: Self-pay | Admitting: Gastroenterology

## 2018-09-12 DIAGNOSIS — B181 Chronic viral hepatitis B without delta-agent: Secondary | ICD-10-CM

## 2018-09-22 ENCOUNTER — Ambulatory Visit
Admission: RE | Admit: 2018-09-22 | Discharge: 2018-09-22 | Disposition: A | Discharge status: Home / Self Care | Payer: BC Managed Care – PPO | Source: Ambulatory Visit | Attending: Gastroenterology | Admitting: Gastroenterology

## 2018-09-22 DIAGNOSIS — B181 Chronic viral hepatitis B without delta-agent: Secondary | ICD-10-CM

## 2019-03-15 ENCOUNTER — Ambulatory Visit: Payer: BC Managed Care – PPO | Attending: Family

## 2019-03-15 DIAGNOSIS — Z23 Encounter for immunization: Secondary | ICD-10-CM

## 2019-03-15 NOTE — Progress Notes (Signed)
   Covid-19 Vaccination Clinic  Name:  John Diaz    MRN: [id number] DOB: 06-13-1945  03/15/2019  John Diaz was observed post Covid-19 immunization for 15 minutes without incidence. He was provided with Vaccine Information Sheet and instruction to access the V-Safe system.   John Diaz was instructed to call 911 with any severe reactions post vaccine: Marland Kitchen Difficulty breathing  . Swelling of your face and throat  . A fast heartbeat  . A bad rash all over your body  . Dizziness and weakness    Immunizations Administered    Name Date Dose VIS Date Route   Moderna COVID-19 Vaccine 03/15/2019 11:57 AM 0.5 mL 01/02/2019 Intramuscular   Manufacturer: Moderna   Lot: [id number]   NDC: 15183-437-35

## 2019-04-17 ENCOUNTER — Ambulatory Visit: Payer: BC Managed Care – PPO | Attending: Family

## 2019-04-17 DIAGNOSIS — Z23 Encounter for immunization: Secondary | ICD-10-CM

## 2019-04-17 NOTE — Progress Notes (Signed)
   Covid-19 Vaccination Clinic  Name:  John Diaz    MRN: [id number] DOB: 1945-05-30  04/17/2019  [name] was observed post Covid-19 immunization for 15 minutes without incident. He was provided with Vaccine Information Sheet and instruction to access the V-Safe system.   [name] was instructed to call 911 with any severe reactions post vaccine: Marland Kitchen Difficulty breathing  . Swelling of face and throat  . A fast heartbeat  . A bad rash all over body  . Dizziness and weakness   Immunizations Administered    Name Date Dose VIS Date Route   Moderna COVID-19 Vaccine 04/17/2019 10:42 AM 0.5 mL 01/02/2019 Intramuscular   Manufacturer: Moderna   Lot: [id number]   NDC: 34193-790-24

## 2019-08-26 IMAGING — CT CT HEAD W/O CM
4 series · 16 of 47 positions shown, 18 images · non-contrast
Comparison: 01/09/2018 CT head.

CLINICAL DATA: 72 y/o M; subdural hemorrhage, burr hole, for
follow-up.

EXAM:
CT HEAD WITHOUT CONTRAST
TECHNIQUE: Contiguous axial images were obtained from the base of the skull
through the vertex without intravenous contrast.

[Series 3: head without · axial · non-contrast · 0.45mm/px · z∈[+1115,+1240]mm · 7 of 35 slices shown, 9 images]
[im 5/35  brain]
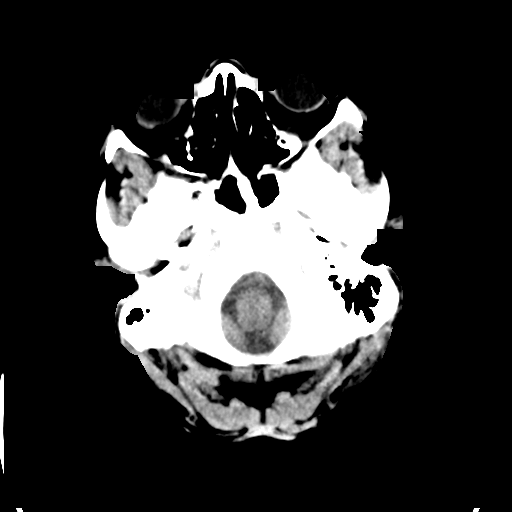
[im 5/35  bone]
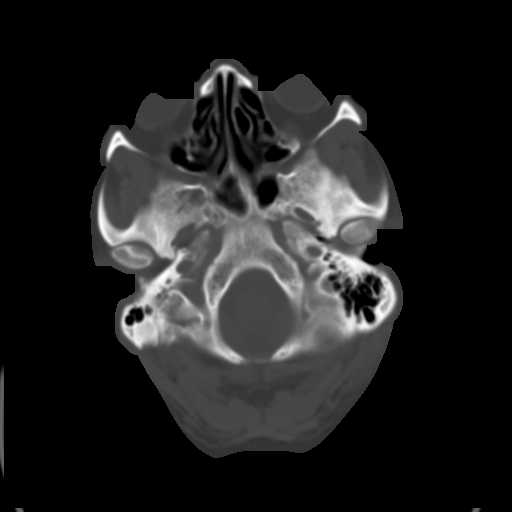
[im 9/35  brain]
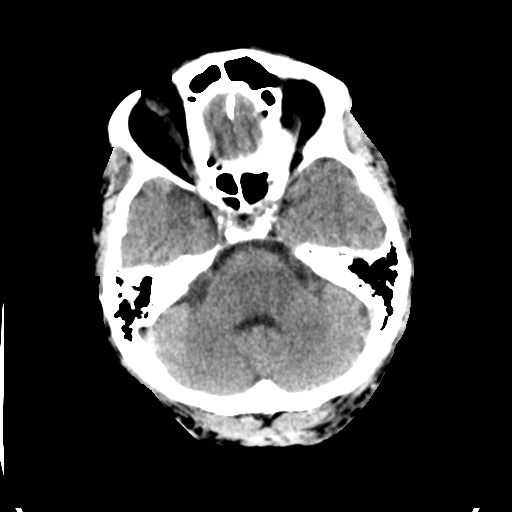
[im 13/35  brain]
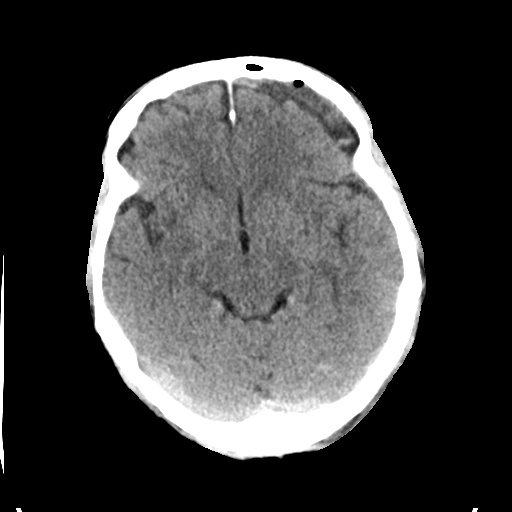
[im 18/35  brain]
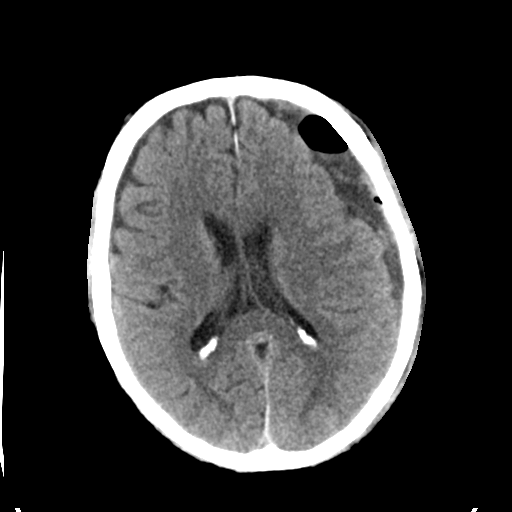
[im 22/35  brain]
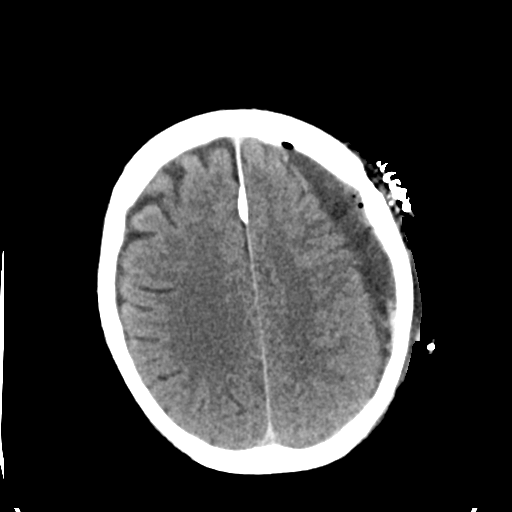
[im 22/35  bone]
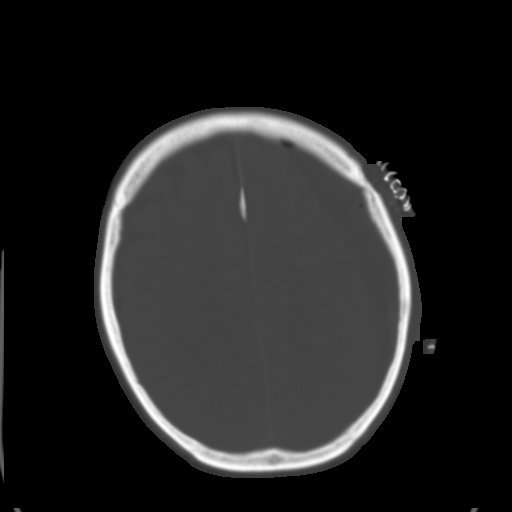
[im 26/35  brain]
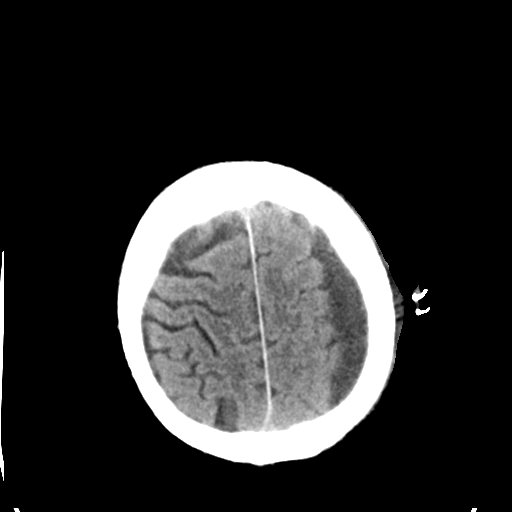
[im 30/35  brain]
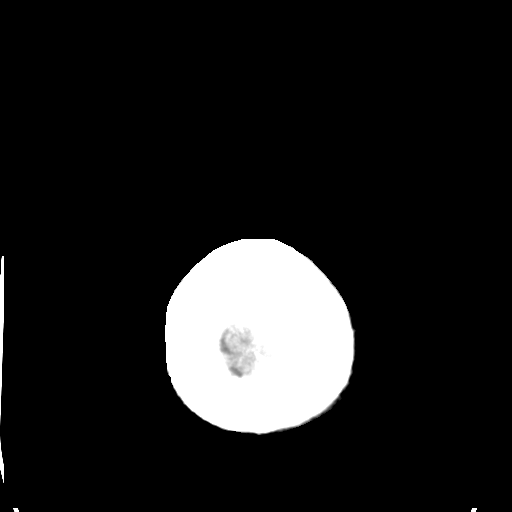

[Series 4: head bone · axial · 0.45mm/px · z∈[+1111,+1145]mm · 3 of 87 slices shown]
[im 9/87  bone]
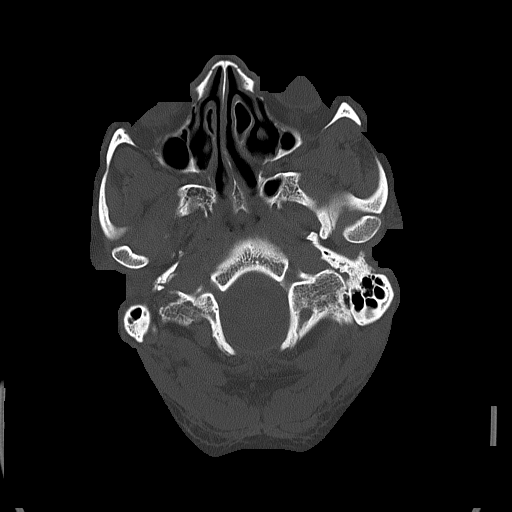
[im 18/87  bone]
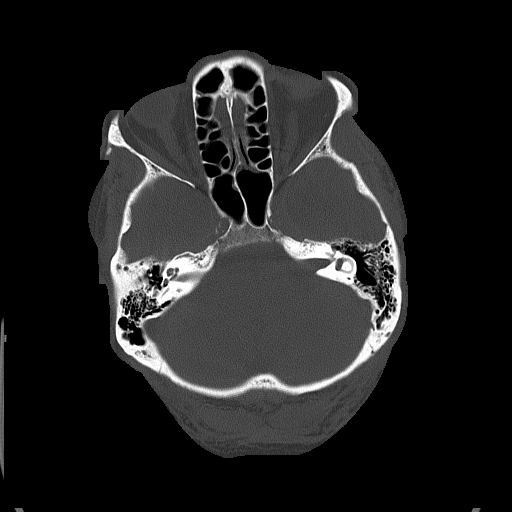
[im 26/87  bone]
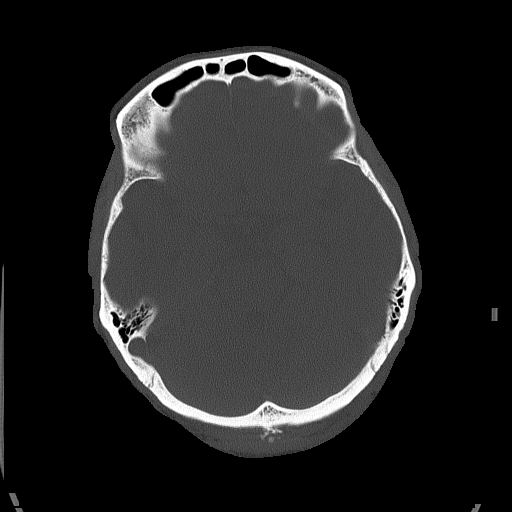

[Series 5: head without cor · coronal · non-contrast · 0.34mm/px · 3 of 69 slices shown]
[im 23/69  brain]
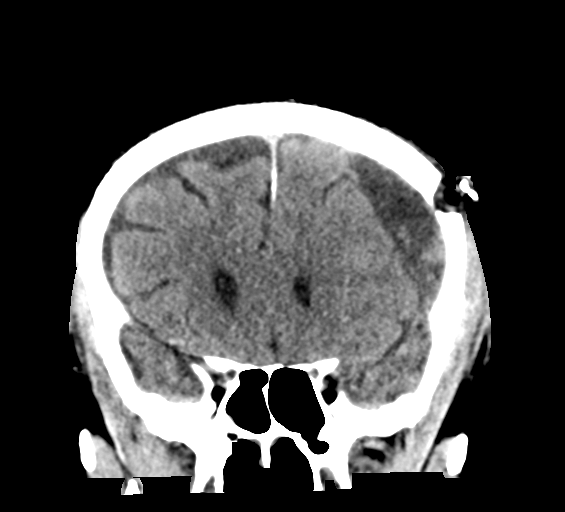
[im 31/69  brain]
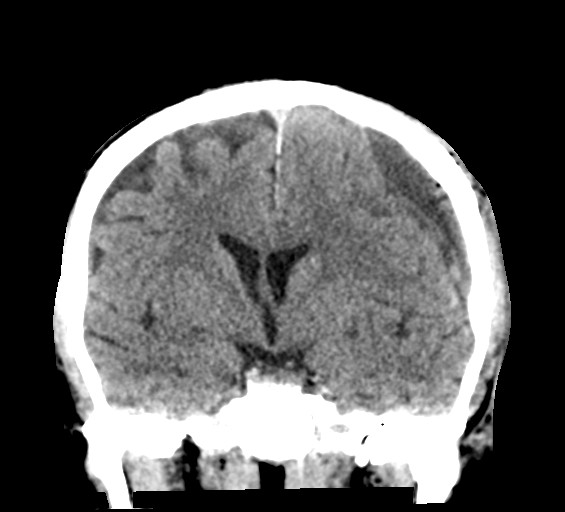
[im 38/69  brain]
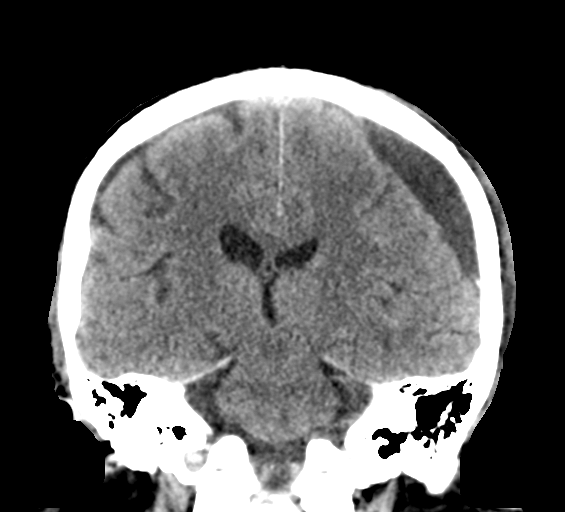

[Series 6: head without sag · sagittal · non-contrast · 0.34mm/px · 3 of 67 slices shown]
[im 23/67  brain]
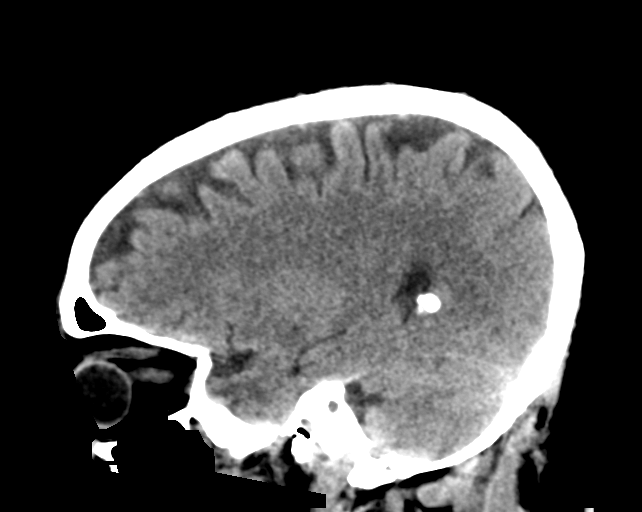
[im 34/67  brain]
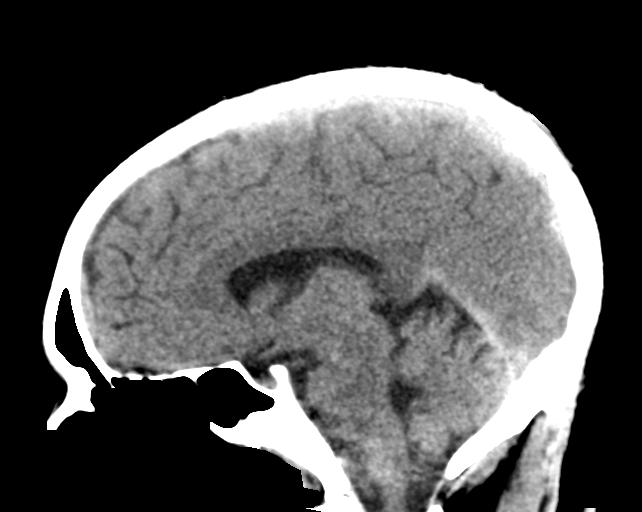
[im 45/67  brain]
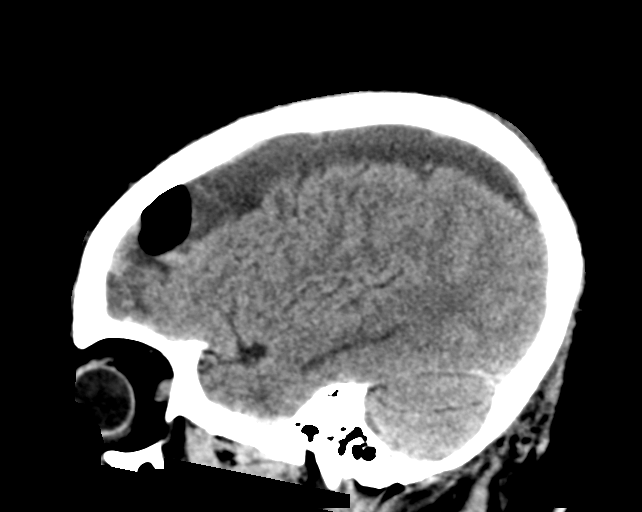

[16 of 47 positions shown; findings below may reference images not displayed]

FINDINGS: Brain: Left-sided subdural hematoma, predominantly low in
attenuation, with multiple hyperintense retracted clots. No new
hyperdense material to suggest interval hemorrhage. Decreased air
within the collection. Interval removal of the drain. The hematoma
measures up to 17 mm in thickness and is is mass effect on the brain
with 4 mm of left-to-right midline shift which is stable.

Trace volume of subarachnoid hemorrhage with some redistribution
over the convexities. No new stroke, brain parenchymal hemorrhage,
extra-axial hemorrhage, or focal mass effect identified. No downward
herniation. Stable chronic microvascular ischemic changes and volume
loss of the brain.

Vascular: Calcific atherosclerosis of carotid siphons. No hyperdense
vessel identified.

Skull: Postsurgical changes related to left frontal burr hole with
air and edema in the overlying scalp as well as skin staples is
stable.

Sinuses/Orbits: Mild paranasal sinus mucosal thickening. Normal
aeration of mastoid air cells. Bilateral intra-ocular lens
replacement.

Other: None.
IMPRESSION: 1. Stable size of left-sided subdural hematoma post drain removal.
Stable mass effect with 4 mm left-to-right midline shift.
2. Stable trace subarachnoid hemorrhage with some redistribution
over convexities.
3. No new acute intracranial abnormality identified.

## 2019-09-01 IMAGING — DX DG CHEST 2V
2 series · 2 of 2 positions shown · non-contrast
Comparison: No prior.

CLINICAL DATA: Productive cough.

EXAM:
CHEST - 2 VIEW

[dg chest 2 view (1 of 2)]
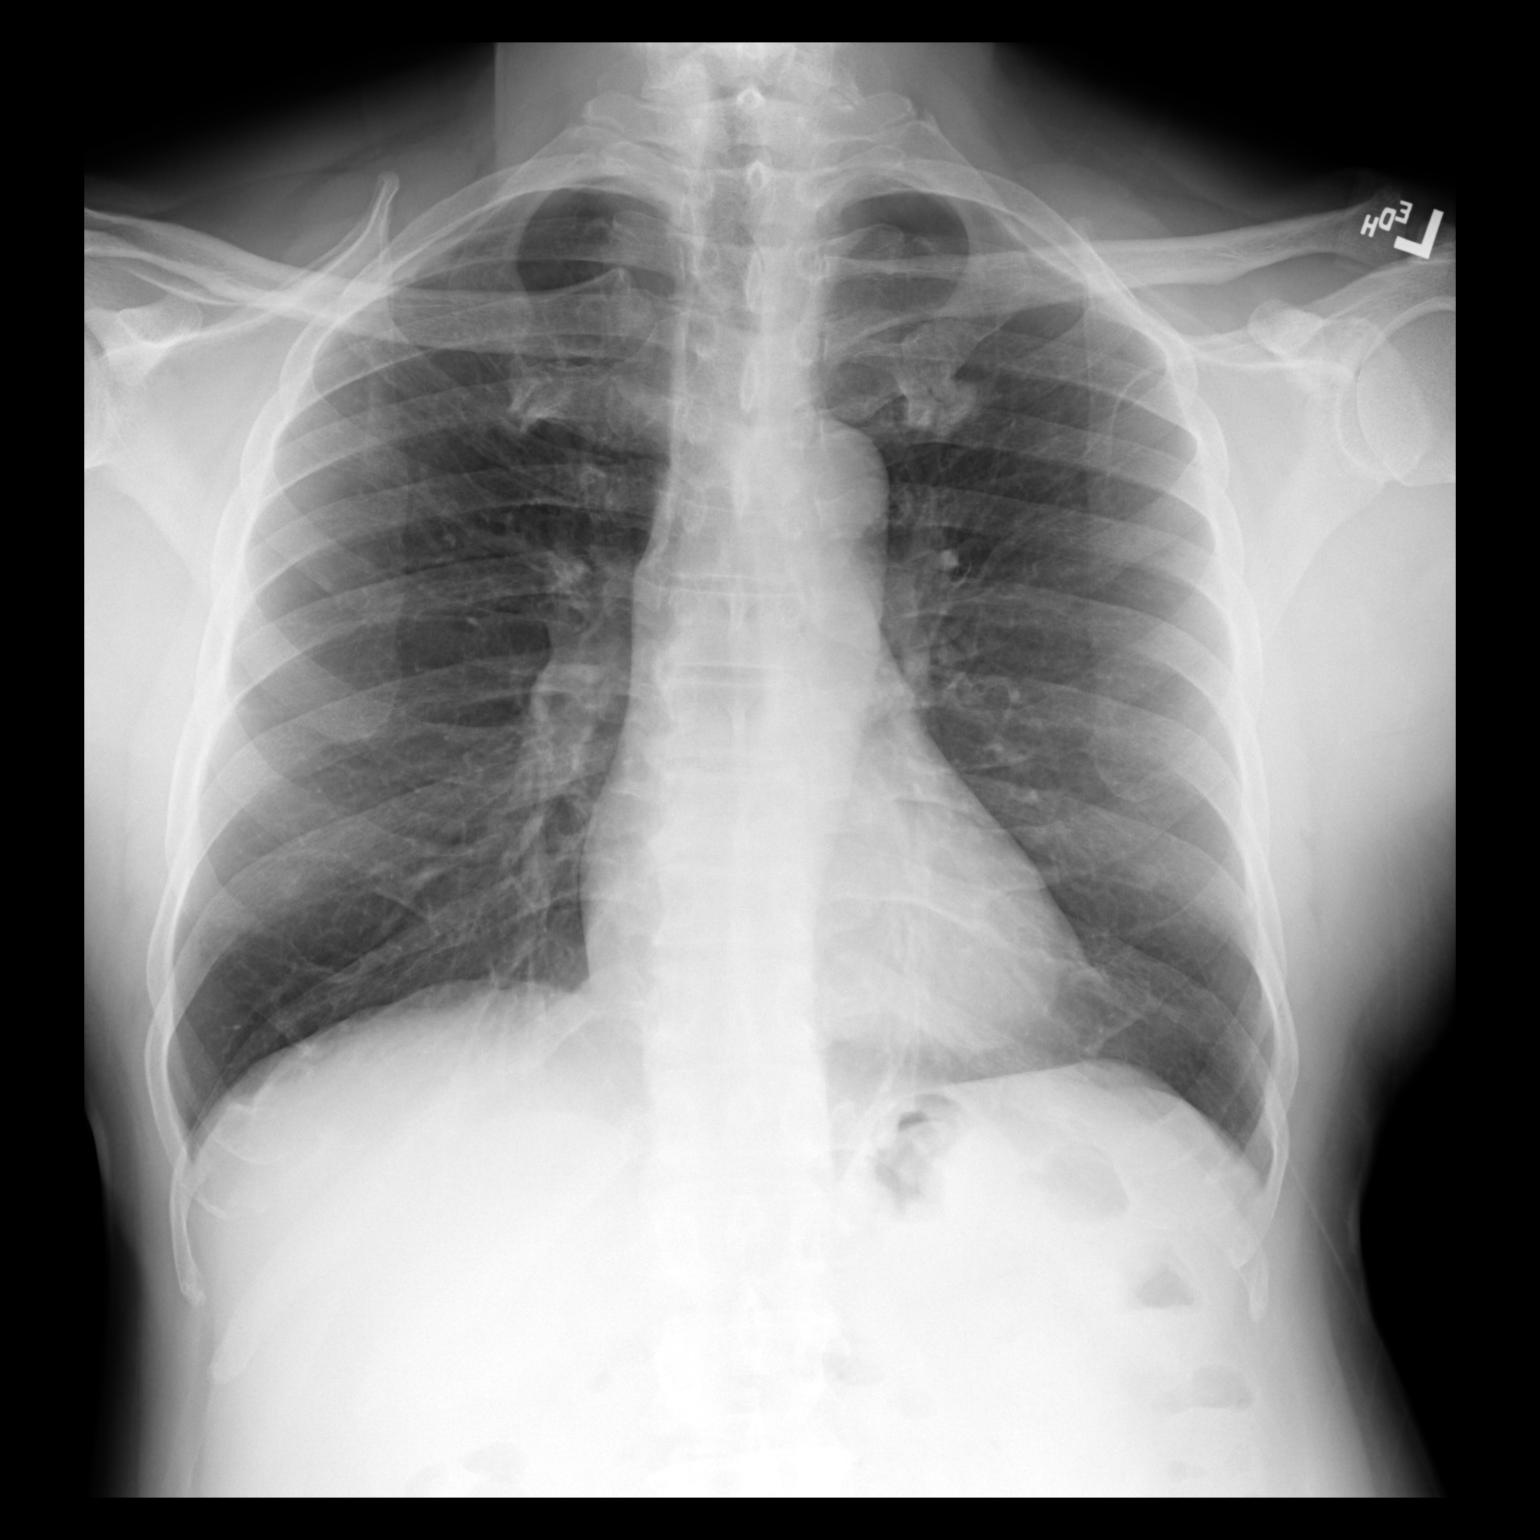

[dg chest 2 view (2 of 2)]
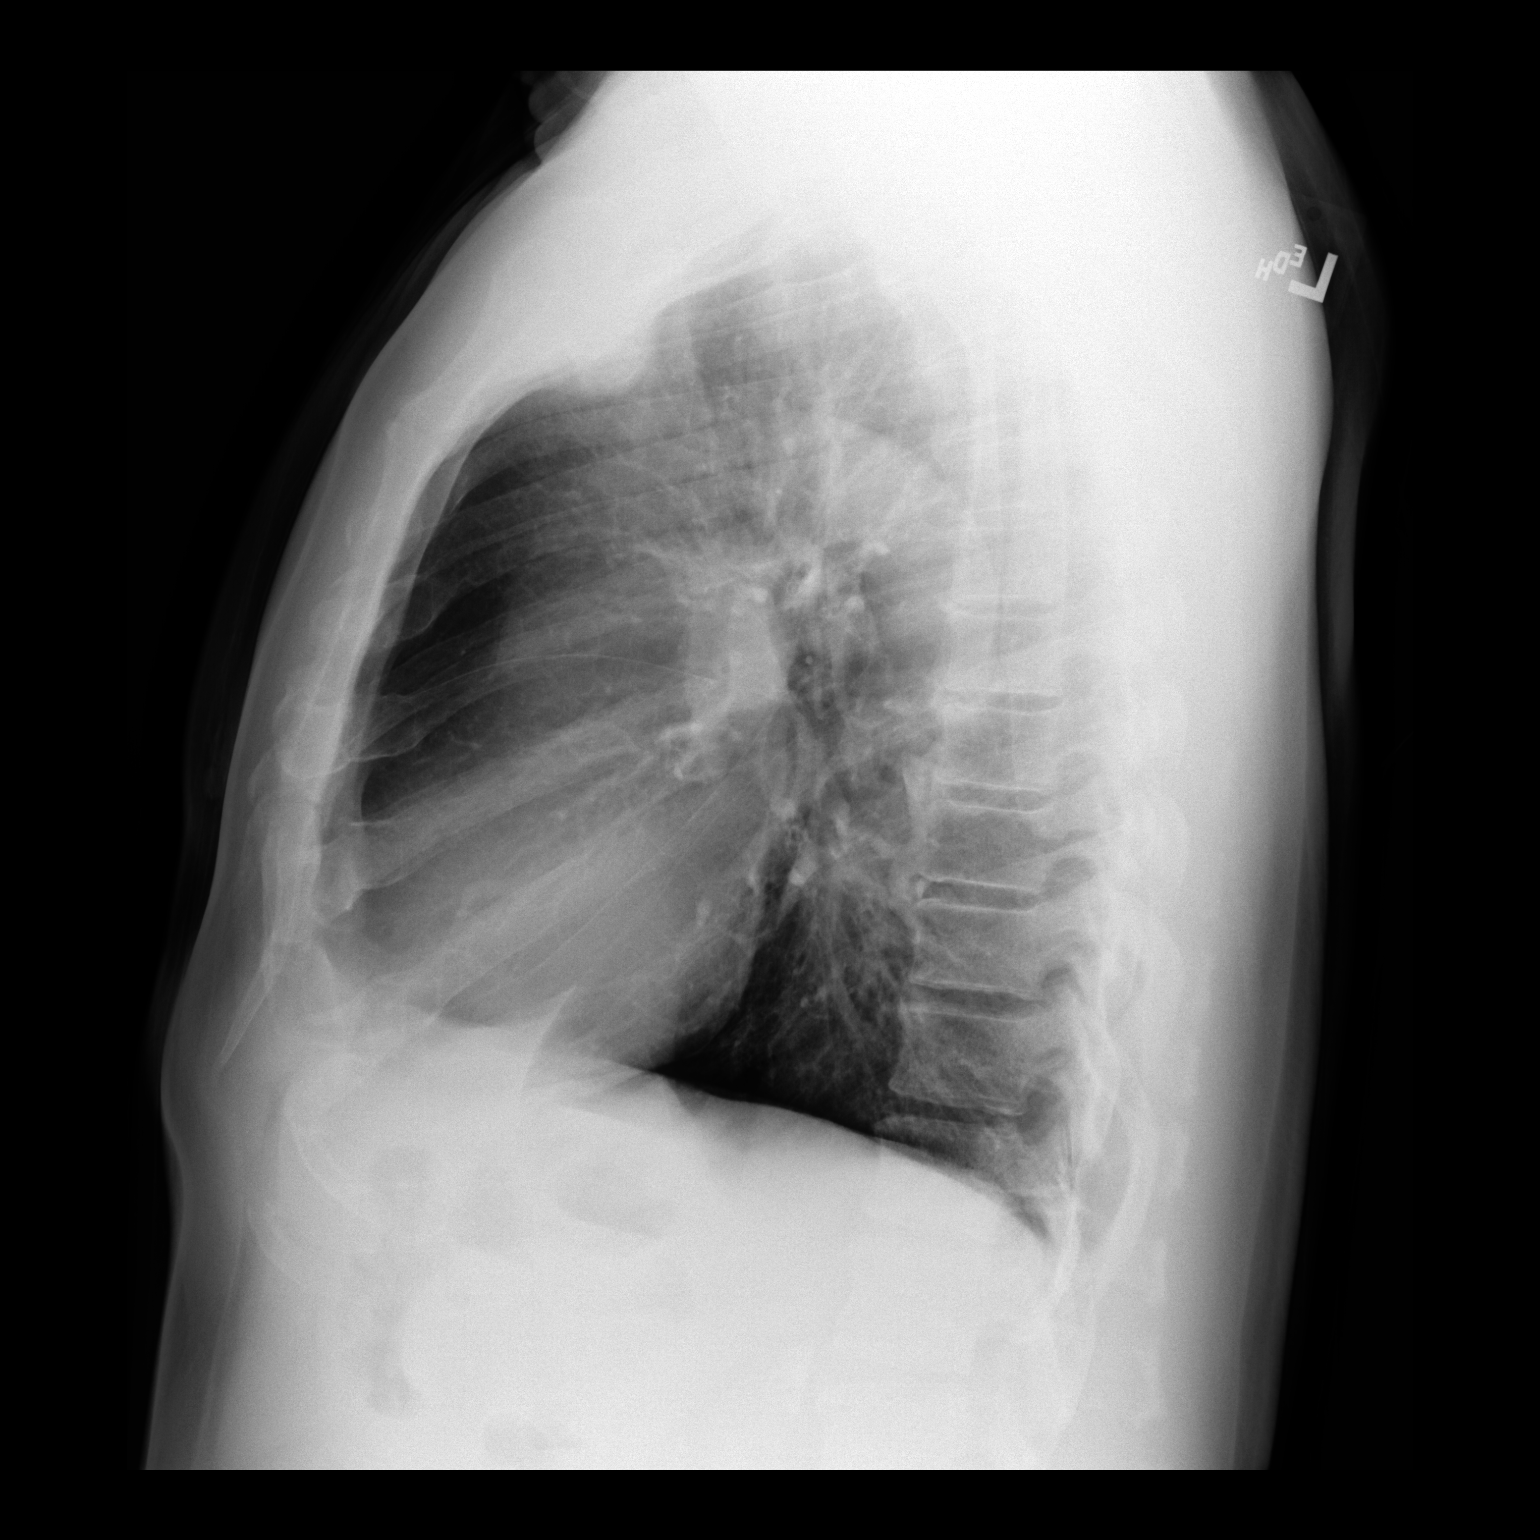

[2 of 2 positions shown; findings below may reference images not displayed]

FINDINGS: Mediastinum and hilar structures normal. Lungs are clear. No pleural
effusion or pneumothorax. Symmetric solitary nodular opacities noted
projected over both lung bases on PA view only consistent with
nipple shadows. Heart size normal. Mild scoliosis thoracic spine.
IMPRESSION: No acute cardiopulmonary disease.

## 2019-09-04 ENCOUNTER — Other Ambulatory Visit: Payer: Self-pay | Admitting: Gastroenterology

## 2019-09-04 DIAGNOSIS — B181 Chronic viral hepatitis B without delta-agent: Secondary | ICD-10-CM

## 2019-09-11 ENCOUNTER — Ambulatory Visit
Admission: RE | Admit: 2019-09-11 | Discharge: 2019-09-11 | Disposition: A | Discharge status: Home / Self Care | Payer: BC Managed Care – PPO | Source: Ambulatory Visit | Attending: Gastroenterology | Admitting: Gastroenterology

## 2019-09-11 DIAGNOSIS — B181 Chronic viral hepatitis B without delta-agent: Secondary | ICD-10-CM

## 2019-12-19 ENCOUNTER — Ambulatory Visit: Payer: BC Managed Care – PPO | Attending: Family

## 2019-12-19 DIAGNOSIS — Z23 Encounter for immunization: Secondary | ICD-10-CM

## 2020-03-21 NOTE — Progress Notes (Signed)
   Covid-19 Vaccination Clinic  Name:  Dyshawn Cangelosi    MRN: [id number] DOB: 08/17/1945  03/21/2020  Mr. Harkins was observed post Covid-19 immunization for 15 minutes without incident. He was provided with Vaccine Information Sheet and instruction to access the V-Safe system.   Mr. Levi was instructed to call 911 with any severe reactions post vaccine: Marland Kitchen Difficulty breathing  . Swelling of face and throat  . A fast heartbeat  . A bad rash all over body  . Dizziness and weakness   Immunizations Administered    Name Date Dose VIS Date Route   Moderna Covid-19 Booster Vaccine 12/19/2019 11:50 AM 0.25 mL 11/21/2019 Intramuscular   Manufacturer: Moderna   Lot: [id number]   NDC: 15947-076-15

## 2020-10-20 ENCOUNTER — Other Ambulatory Visit: Payer: Self-pay | Admitting: Gastroenterology

## 2020-10-20 DIAGNOSIS — B181 Chronic viral hepatitis B without delta-agent: Secondary | ICD-10-CM

## 2020-10-23 ENCOUNTER — Ambulatory Visit
Admission: RE | Admit: 2020-10-23 | Discharge: 2020-10-23 | Disposition: A | Discharge status: Home / Self Care | Payer: BC Managed Care – PPO | Source: Ambulatory Visit | Attending: Gastroenterology | Admitting: Gastroenterology

## 2020-10-23 DIAGNOSIS — B181 Chronic viral hepatitis B without delta-agent: Secondary | ICD-10-CM

## 2020-10-24 ENCOUNTER — Other Ambulatory Visit: Payer: Self-pay | Admitting: Gastroenterology

## 2020-10-24 DIAGNOSIS — R9389 Abnormal findings on diagnostic imaging of other specified body structures: Secondary | ICD-10-CM

## 2020-10-24 DIAGNOSIS — B181 Chronic viral hepatitis B without delta-agent: Secondary | ICD-10-CM

## 2020-10-29 ENCOUNTER — Other Ambulatory Visit: Payer: BC Managed Care – PPO

## 2020-10-31 ENCOUNTER — Other Ambulatory Visit: Payer: BC Managed Care – PPO

## 2020-11-05 ENCOUNTER — Ambulatory Visit
Admission: RE | Admit: 2020-11-05 | Discharge: 2020-11-05 | Disposition: A | Discharge status: Home / Self Care | Payer: BC Managed Care – PPO | Source: Ambulatory Visit | Attending: Gastroenterology | Admitting: Gastroenterology

## 2020-11-05 DIAGNOSIS — B181 Chronic viral hepatitis B without delta-agent: Secondary | ICD-10-CM

## 2020-11-05 DIAGNOSIS — R9389 Abnormal findings on diagnostic imaging of other specified body structures: Secondary | ICD-10-CM

## 2021-10-12 ENCOUNTER — Other Ambulatory Visit: Payer: Self-pay | Admitting: Gastroenterology

## 2021-10-12 DIAGNOSIS — B181 Chronic viral hepatitis B without delta-agent: Secondary | ICD-10-CM

## 2021-10-20 ENCOUNTER — Ambulatory Visit
Admission: RE | Admit: 2021-10-20 | Discharge: 2021-10-20 | Disposition: A | Discharge status: Home / Self Care | Payer: BC Managed Care – PPO | Source: Ambulatory Visit | Attending: Gastroenterology | Admitting: Gastroenterology

## 2021-10-20 DIAGNOSIS — B181 Chronic viral hepatitis B without delta-agent: Secondary | ICD-10-CM

## 2022-03-11 ENCOUNTER — Other Ambulatory Visit: Payer: Self-pay | Admitting: Internal Medicine

## 2022-03-11 ENCOUNTER — Ambulatory Visit
Admission: RE | Admit: 2022-03-11 | Discharge: 2022-03-11 | Disposition: A | Discharge status: Home / Self Care | Payer: BC Managed Care – PPO | Source: Ambulatory Visit | Attending: Internal Medicine | Admitting: Internal Medicine

## 2022-03-11 DIAGNOSIS — M542 Cervicalgia: Secondary | ICD-10-CM

## 2022-03-11 DIAGNOSIS — Z8679 Personal history of other diseases of the circulatory system: Secondary | ICD-10-CM

## 2022-03-11 DIAGNOSIS — S0990XA Unspecified injury of head, initial encounter: Secondary | ICD-10-CM

## 2022-03-11 DIAGNOSIS — W19XXXA Unspecified fall, initial encounter: Secondary | ICD-10-CM

## 2022-03-19 ENCOUNTER — Ambulatory Visit
Admission: RE | Admit: 2022-03-19 | Discharge: 2022-03-19 | Disposition: A | Discharge status: Home / Self Care | Payer: BC Managed Care – PPO | Source: Ambulatory Visit | Attending: Internal Medicine | Admitting: Internal Medicine

## 2022-03-19 DIAGNOSIS — Z8679 Personal history of other diseases of the circulatory system: Secondary | ICD-10-CM

## 2022-03-19 DIAGNOSIS — S0990XA Unspecified injury of head, initial encounter: Secondary | ICD-10-CM

## 2022-03-19 DIAGNOSIS — W19XXXA Unspecified fall, initial encounter: Secondary | ICD-10-CM

## 2023-02-22 ENCOUNTER — Other Ambulatory Visit: Payer: Self-pay | Admitting: Gastroenterology

## 2023-02-22 DIAGNOSIS — B181 Chronic viral hepatitis B without delta-agent: Secondary | ICD-10-CM

## 2023-04-08 ENCOUNTER — Ambulatory Visit
Admission: RE | Admit: 2023-04-08 | Discharge: 2023-04-08 | Disposition: A | Discharge status: Home / Self Care | Payer: Medicaid Other | Source: Ambulatory Visit | Attending: Gastroenterology | Admitting: Gastroenterology

## 2023-04-08 DIAGNOSIS — B181 Chronic viral hepatitis B without delta-agent: Secondary | ICD-10-CM

## 2023-11-30 ENCOUNTER — Other Ambulatory Visit: Payer: Self-pay | Admitting: Gastroenterology

## 2023-11-30 DIAGNOSIS — B181 Chronic viral hepatitis B without delta-agent: Secondary | ICD-10-CM

## 2023-11-30 DIAGNOSIS — K760 Fatty (change of) liver, not elsewhere classified: Secondary | ICD-10-CM

## 2023-12-09 ENCOUNTER — Ambulatory Visit
Admission: RE | Admit: 2023-12-09 | Discharge: 2023-12-09 | Disposition: A | Discharge status: Home / Self Care | Source: Ambulatory Visit | Attending: Gastroenterology | Admitting: Gastroenterology

## 2023-12-09 DIAGNOSIS — K760 Fatty (change of) liver, not elsewhere classified: Secondary | ICD-10-CM

## 2023-12-09 DIAGNOSIS — B181 Chronic viral hepatitis B without delta-agent: Secondary | ICD-10-CM
